# Patient Record
Sex: Female | Born: 1945
Health system: Southern US, Community
[De-identification: ages and names within clinical notes are randomized; demographics above are authoritative.]

## PROBLEM LIST (undated history)

## (undated) DIAGNOSIS — K219 Gastro-esophageal reflux disease without esophagitis: Secondary | ICD-10-CM

## (undated) DIAGNOSIS — N390 Urinary tract infection, site not specified: Secondary | ICD-10-CM

## (undated) DIAGNOSIS — E278 Other specified disorders of adrenal gland: Secondary | ICD-10-CM

## (undated) DIAGNOSIS — K5790 Diverticulosis of intestine, part unspecified, without perforation or abscess without bleeding: Secondary | ICD-10-CM

## (undated) DIAGNOSIS — M810 Age-related osteoporosis without current pathological fracture: Secondary | ICD-10-CM

## (undated) DIAGNOSIS — K5190 Ulcerative colitis, unspecified, without complications: Secondary | ICD-10-CM

## (undated) DIAGNOSIS — F32A Depression, unspecified: Secondary | ICD-10-CM

## (undated) DIAGNOSIS — F329 Major depressive disorder, single episode, unspecified: Secondary | ICD-10-CM

## (undated) DIAGNOSIS — F419 Anxiety disorder, unspecified: Secondary | ICD-10-CM

## (undated) DIAGNOSIS — E785 Hyperlipidemia, unspecified: Secondary | ICD-10-CM

## (undated) DIAGNOSIS — K589 Irritable bowel syndrome without diarrhea: Secondary | ICD-10-CM

## (undated) HISTORY — DX: Other specified disorders of adrenal gland: E27.8

## (undated) HISTORY — DX: Ulcerative colitis, unspecified, without complications: K51.90

## (undated) HISTORY — DX: Diverticulosis of intestine, part unspecified, without perforation or abscess without bleeding: K57.90

## (undated) HISTORY — DX: Depression, unspecified: F32.A

## (undated) HISTORY — PX: OTHER SURGICAL HISTORY: SHX169

## (undated) HISTORY — DX: Irritable bowel syndrome, unspecified: K58.9

## (undated) HISTORY — DX: Age-related osteoporosis without current pathological fracture: M81.0

## (undated) HISTORY — DX: Anxiety disorder, unspecified: F41.9

## (undated) HISTORY — DX: Major depressive disorder, single episode, unspecified: F32.9

## (undated) HISTORY — PX: COLONOSCOPY: SHX174

## (undated) HISTORY — PX: BREAST SURGERY: SHX581

## (undated) HISTORY — DX: Urinary tract infection, site not specified: N39.0

## (undated) HISTORY — DX: Hyperlipidemia, unspecified: E78.5

## (undated) HISTORY — DX: Gastro-esophageal reflux disease without esophagitis: K21.9

---

## 2002-07-20 ENCOUNTER — Encounter: Admission: RE | Admit: 2002-07-20 | Discharge: 2002-07-20 | Payer: Self-pay | Admitting: Family Medicine

## 2002-07-20 ENCOUNTER — Encounter: Payer: Self-pay | Admitting: Family Medicine

## 2003-10-11 ENCOUNTER — Encounter: Admission: RE | Admit: 2003-10-11 | Discharge: 2003-10-11 | Payer: Self-pay | Admitting: Family Medicine

## 2004-07-04 ENCOUNTER — Other Ambulatory Visit: Admission: RE | Admit: 2004-07-04 | Discharge: 2004-07-04 | Payer: Self-pay | Admitting: Family Medicine

## 2004-10-23 ENCOUNTER — Encounter: Admission: RE | Admit: 2004-10-23 | Discharge: 2004-10-23 | Payer: Self-pay | Admitting: Family Medicine

## 2005-12-17 ENCOUNTER — Encounter: Admission: RE | Admit: 2005-12-17 | Discharge: 2005-12-17 | Payer: Self-pay | Admitting: Family Medicine

## 2007-01-08 ENCOUNTER — Encounter: Admission: RE | Admit: 2007-01-08 | Discharge: 2007-01-08 | Payer: Self-pay | Admitting: Family Medicine

## 2008-02-19 ENCOUNTER — Encounter: Admission: RE | Admit: 2008-02-19 | Discharge: 2008-02-19 | Payer: Self-pay | Admitting: Internal Medicine

## 2009-03-17 ENCOUNTER — Encounter: Admission: RE | Admit: 2009-03-17 | Discharge: 2009-03-17 | Payer: Self-pay | Admitting: Internal Medicine

## 2009-08-24 ENCOUNTER — Encounter (INDEPENDENT_AMBULATORY_CARE_PROVIDER_SITE_OTHER): Payer: Self-pay | Admitting: *Deleted

## 2009-08-28 ENCOUNTER — Ambulatory Visit: Payer: Self-pay | Admitting: Internal Medicine

## 2009-09-11 ENCOUNTER — Ambulatory Visit: Payer: Self-pay | Admitting: Internal Medicine

## 2010-02-10 ENCOUNTER — Encounter: Payer: Self-pay | Admitting: Family Medicine

## 2010-02-11 ENCOUNTER — Encounter: Payer: Self-pay | Admitting: Internal Medicine

## 2010-02-19 ENCOUNTER — Other Ambulatory Visit: Payer: Self-pay | Admitting: Internal Medicine

## 2010-02-19 DIAGNOSIS — Z1239 Encounter for other screening for malignant neoplasm of breast: Secondary | ICD-10-CM

## 2010-02-20 NOTE — Procedures (Signed)
Summary: Colonoscopy  Patient: Rhonda Woods Note: All result statuses are Final unless otherwise noted.  Tests: (1) Colonoscopy (COL)   COL Colonoscopy           DONE     [facility]     [address]     [place], [place]  [address]           COLONOSCOPY PROCEDURE REPORT           PATIENT:  Rhonda Woods, Rhonda Woods  MR#:  [id number]     BIRTHDATE:  1945-06-22, 63 yrs. old  GENDER:  female     ENDOSCOPIST:  Wilhemina Bonito. Eda Keys, MD     REF. BY:  Chilton Greathouse, M.D.     PROCEDURE DATE:  09/11/2009     PROCEDURE:  Average-risk screening colonoscopy     G0121     ASA CLASS:  Class II     INDICATIONS:  Routine Risk Screening ; reports negative     colonoscopy about 10 years ago in [place], [place].     MEDICATIONS:   Fentanyl 100 mcg IV, Versed 9 mg IV           DESCRIPTION OF PROCEDURE:   After the risks benefits and     alternatives of the procedure were thoroughly explained, informed     consent was obtained.  Digital rectal exam was performed and     revealed no abnormalities.   The LB CF-[id number] endoscope     was introduced through the anus and advanced to the cecum, which     was identified by both the appendix and ileocecal valve, without     limitations.Time to cecum = 4:02 min. The quality of the prep was     excellent, using MoviPrep.  The instrument was then slowly     withdrawn (time = 12:26 min) as the colon was fully examined.     <<PROCEDUREIMAGES>>           FINDINGS:  Moderate diverticulosis was found in the sigmoid to     descending colon segments.  This was otherwise a normal     examination of the colon.  No polyps or cancers were seen.     Retroflexed views in the rectum revealed internal hemorrhoids.     The scope was then withdrawn from the patient and the procedure     completed.           COMPLICATIONS:  None     ENDOSCOPIC IMPRESSION:     1) Moderate diverticulosis in the sigmoid to descending colon     segments     2) Otherwise normal  examination     3) No polyps or cancers     4) Internal hemorrhoids           RECOMMENDATIONS:     1) Continue current colorectal screening recommendations for     "routine risk" patients with a repeat colonoscopy in 10 years.     ______________________________     Wilhemina Bonito. Eda Keys, MD           CC:  Chilton Greathouse, MD; The Patient           n.     eSIGNED:   Wilhemina Bonito. Eda Keys at 09/11/2009 10:13 AM           SummerPaizlee, Kinder, [id number]  Note: An exclamation mark (!) indicates a result that was not dispersed into the flowsheet. Document Creation Date:  09/11/2009 10:15 AM _______________________________________________________________________  (1) Order result status: Final Collection or observation date-time: 09/11/2009 10:09 Requested date-time:  Receipt date-time:  Reported date-time:  Referring Physician:   Ordering Physician: Fransico Setters [phone]) Specimen Source:  Source: Launa Grill Order Number: [id number] Lab site:   Appended Document: Colonoscopy    Clinical Lists Changes  Observations: Added new observation of COLONNXTDUE: 08/2019 (09/11/2009 13:34)

## 2010-02-20 NOTE — Miscellaneous (Signed)
Summary: previsit prep/rm  Clinical Lists Changes  Medications: Added new medication of MOVIPREP 100 GM  SOLR (PEG-KCL-NACL-NASULF-NA ASC-C) As per prep instructions. - Signed Rx of MOVIPREP 100 GM  SOLR (PEG-KCL-NACL-NASULF-NA ASC-C) As per prep instructions.;  #1 x 0;  Signed;  Entered by: Sundra Aland RN;  Authorized by: Irene Shipper MD;  Method used: Electronically to [facility] # [address] [place], [place]  [address], Ph: [phone], Fax: [phone] Allergies: Added new allergy or adverse reaction of DEMEROL Observations: Added new observation of NKA: F (08/28/2009 10:13)    Prescriptions: MOVIPREP 100 GM  SOLR (PEG-KCL-NACL-NASULF-NA ASC-C) As per prep instructions.  #1 x 0   Entered by:   Sundra Aland RN   Authorized by:   Irene Shipper MD   Signed by:   Sundra Aland RN on 08/28/2009   Method used:   Electronically to        [facility] # [address] (retail)       [place], [place]  [address]       Ph: [phone]       Fax: [phone]   RxID:   [id number]   Appended Document: previsit prep/rm    Clinical Lists Changes  Allergies: Added new allergy or adverse reaction of VICODIN

## 2010-02-20 NOTE — Letter (Signed)
Summary: [facility] Instructions  [facility]  [place], [place] [address]   Phone: [phone]  Fax: [phone]       Rhonda Woods    1945/12/31    MRN: [id number]        Procedure Day /Date:  Monday 09/11/2009     Arrival Time:  8:30 am      Procedure Time: 9:30 am     Location of Procedure:                    _ x_  [place] (4th Floor)                        [place]   Starting 5 days prior to your procedure Wednesday 8/17 do not eat nuts, seeds, popcorn, corn, beans, peas,  salads, or any raw vegetables.  Do not take any fiber supplements (e.g. Metamucil, Citrucel, and Benefiber).  THE DAY BEFORE YOUR PROCEDURE         DATE: Sunday 8/21  1.  Drink clear liquids the entire day-NO SOLID FOOD  2.  Do not drink anything colored red or purple.  Avoid juices with pulp.  No orange juice.  3.  Drink at least 64 oz. (8 glasses) of fluid/clear liquids during the day to prevent dehydration and help the prep work efficiently.  CLEAR LIQUIDS INCLUDE: Water Jello Ice Popsicles Tea (sugar ok, no milk/cream) Powdered fruit flavored drinks Coffee (sugar ok, no milk/cream) Gatorade Juice: apple, white grape, white cranberry  Lemonade Clear bullion, consomm, broth Carbonated beverages (any kind) Strained chicken noodle soup Hard Candy                             4.  In the morning, mix first dose of MoviPrep solution:    Empty 1 Pouch A and 1 Pouch B into the disposable container    Add lukewarm drinking water to the top line of the container. Mix to dissolve    Refrigerate (mixed solution should be used within 24 hrs)  5.  Begin drinking the prep at 5:00 p.m. The MoviPrep container is divided by 4 marks.   Every 15 minutes drink the solution down to the next mark (approximately 8 oz) until the full liter is complete.   6.  Follow completed prep with 16 oz of clear liquid of your choice (Nothing  red or purple).  Continue to drink clear liquids until bedtime.  7.  Before going to bed, mix second dose of MoviPrep solution:    Empty 1 Pouch A and 1 Pouch B into the disposable container    Add lukewarm drinking water to the top line of the container. Mix to dissolve    Refrigerate  THE DAY OF YOUR PROCEDURE      DATE: Monday 8/22  Beginning at 4:30 a.m. (5 hours before procedure):         1. Every 15 minutes, drink the solution down to the next mark (approx 8 oz) until the full liter is complete.  2. Follow completed prep with 16 oz. of clear liquid of your choice.    3. You may drink clear liquids until 7:30 am (2 HOURS BEFORE PROCEDURE).   MEDICATION INSTRUCTIONS  Unless otherwise instructed, you should take regular prescription medications with a small sip of water   as early as possible the morning  of your procedure.    Additional medication instructions: n/a         OTHER INSTRUCTIONS  You will need a responsible adult at least 66 years of age to accompany you and drive you home.   This person must remain in the waiting room during your procedure.  Wear loose fitting clothing that is easily removed.  Leave jewelry and other valuables at home.  However, you may wish to bring a book to read or  an iPod/MP3 player to listen to music as you wait for your procedure to start.  Remove all body piercing jewelry and leave at home.  Total time from sign-in until discharge is approximately 2-3 hours.  You should go home directly after your procedure and rest.  You can resume normal activities the  day after your procedure.  The day of your procedure you should not:   Drive   Make legal decisions   Operate machinery   Drink alcohol   Return to work  You will receive specific instructions about eating, activities and medications before you leave.    The above instructions have been reviewed and explained to me by   Sundra Aland RN  August 28, 2009 10:40  AM     I fully understand and can verbalize these instructions _____________________________ Date _________

## 2010-03-22 ENCOUNTER — Ambulatory Visit
Admission: RE | Admit: 2010-03-22 | Discharge: 2010-03-22 | Disposition: A | Discharge status: Home / Self Care | Payer: BC Managed Care – PPO | Source: Ambulatory Visit | Attending: Internal Medicine | Admitting: Internal Medicine

## 2010-03-22 DIAGNOSIS — Z1239 Encounter for other screening for malignant neoplasm of breast: Secondary | ICD-10-CM

## 2011-03-14 ENCOUNTER — Other Ambulatory Visit: Payer: Self-pay | Admitting: Internal Medicine

## 2011-03-14 DIAGNOSIS — Z1231 Encounter for screening mammogram for malignant neoplasm of breast: Secondary | ICD-10-CM

## 2011-03-29 ENCOUNTER — Ambulatory Visit
Admission: RE | Admit: 2011-03-29 | Discharge: 2011-03-29 | Disposition: A | Discharge status: Home / Self Care | Payer: BC Managed Care – PPO | Source: Ambulatory Visit | Attending: Internal Medicine | Admitting: Internal Medicine

## 2011-03-29 ENCOUNTER — Ambulatory Visit: Payer: BC Managed Care – PPO

## 2011-03-29 DIAGNOSIS — Z1231 Encounter for screening mammogram for malignant neoplasm of breast: Secondary | ICD-10-CM

## 2012-03-20 ENCOUNTER — Other Ambulatory Visit: Payer: Self-pay

## 2012-03-20 DIAGNOSIS — Z1231 Encounter for screening mammogram for malignant neoplasm of breast: Secondary | ICD-10-CM

## 2012-04-24 ENCOUNTER — Ambulatory Visit
Admission: RE | Admit: 2012-04-24 | Discharge: 2012-04-24 | Disposition: A | Discharge status: Home / Self Care | Payer: Medicare Other | Source: Ambulatory Visit

## 2012-04-24 DIAGNOSIS — Z1231 Encounter for screening mammogram for malignant neoplasm of breast: Secondary | ICD-10-CM

## 2012-12-12 ENCOUNTER — Other Ambulatory Visit: Payer: Self-pay | Admitting: Pediatrics

## 2012-12-12 ENCOUNTER — Ambulatory Visit (HOSPITAL_COMMUNITY)
Admission: RE | Admit: 2012-12-12 | Discharge: 2012-12-12 | Disposition: A | Discharge status: Home / Self Care | Payer: Medicare Other | Source: Ambulatory Visit | Attending: Pediatrics | Admitting: Pediatrics

## 2012-12-12 DIAGNOSIS — M25571 Pain in right ankle and joints of right foot: Secondary | ICD-10-CM

## 2012-12-12 DIAGNOSIS — M79609 Pain in unspecified limb: Secondary | ICD-10-CM | POA: Insufficient documentation

## 2012-12-12 DIAGNOSIS — M25579 Pain in unspecified ankle and joints of unspecified foot: Secondary | ICD-10-CM | POA: Insufficient documentation

## 2013-05-19 ENCOUNTER — Other Ambulatory Visit: Payer: Self-pay | Admitting: Internal Medicine

## 2013-05-19 ENCOUNTER — Other Ambulatory Visit: Payer: Self-pay

## 2013-05-19 DIAGNOSIS — Z1231 Encounter for screening mammogram for malignant neoplasm of breast: Secondary | ICD-10-CM

## 2013-07-13 ENCOUNTER — Ambulatory Visit
Admission: RE | Admit: 2013-07-13 | Discharge: 2013-07-13 | Disposition: A | Discharge status: Home / Self Care | Payer: Commercial Managed Care - HMO | Source: Ambulatory Visit

## 2013-07-13 ENCOUNTER — Encounter (INDEPENDENT_AMBULATORY_CARE_PROVIDER_SITE_OTHER): Payer: Self-pay

## 2013-07-13 DIAGNOSIS — Z1231 Encounter for screening mammogram for malignant neoplasm of breast: Secondary | ICD-10-CM

## 2013-09-23 ENCOUNTER — Encounter: Payer: Self-pay | Admitting: Internal Medicine

## 2014-07-15 ENCOUNTER — Other Ambulatory Visit: Payer: Self-pay

## 2014-07-15 DIAGNOSIS — Z1231 Encounter for screening mammogram for malignant neoplasm of breast: Secondary | ICD-10-CM

## 2014-10-11 ENCOUNTER — Other Ambulatory Visit: Payer: Self-pay | Admitting: Internal Medicine

## 2014-10-11 ENCOUNTER — Ambulatory Visit
Admission: RE | Admit: 2014-10-11 | Discharge: 2014-10-11 | Disposition: A | Discharge status: Home / Self Care | Payer: Commercial Managed Care - HMO | Source: Ambulatory Visit

## 2014-10-11 DIAGNOSIS — Z1231 Encounter for screening mammogram for malignant neoplasm of breast: Secondary | ICD-10-CM | POA: Diagnosis not present

## 2014-10-11 DIAGNOSIS — R928 Other abnormal and inconclusive findings on diagnostic imaging of breast: Secondary | ICD-10-CM

## 2014-10-19 ENCOUNTER — Ambulatory Visit
Admission: RE | Admit: 2014-10-19 | Discharge: 2014-10-19 | Disposition: A | Discharge status: Home / Self Care | Payer: Commercial Managed Care - HMO | Source: Ambulatory Visit | Attending: Internal Medicine | Admitting: Internal Medicine

## 2014-10-19 DIAGNOSIS — R928 Other abnormal and inconclusive findings on diagnostic imaging of breast: Secondary | ICD-10-CM

## 2014-10-19 DIAGNOSIS — N63 Unspecified lump in breast: Secondary | ICD-10-CM | POA: Diagnosis not present

## 2014-10-26 ENCOUNTER — Other Ambulatory Visit: Payer: Commercial Managed Care - HMO

## 2014-10-26 ENCOUNTER — Other Ambulatory Visit: Payer: Self-pay | Admitting: Internal Medicine

## 2014-10-26 DIAGNOSIS — N6489 Other specified disorders of breast: Secondary | ICD-10-CM

## 2014-11-07 DIAGNOSIS — H5210 Myopia, unspecified eye: Secondary | ICD-10-CM | POA: Diagnosis not present

## 2014-11-07 DIAGNOSIS — H524 Presbyopia: Secondary | ICD-10-CM | POA: Diagnosis not present

## 2014-12-19 ENCOUNTER — Other Ambulatory Visit: Payer: Commercial Managed Care - HMO

## 2014-12-20 ENCOUNTER — Ambulatory Visit
Admission: RE | Admit: 2014-12-20 | Discharge: 2014-12-20 | Disposition: A | Discharge status: Home / Self Care | Payer: Commercial Managed Care - HMO | Source: Ambulatory Visit | Attending: Internal Medicine | Admitting: Internal Medicine

## 2014-12-20 DIAGNOSIS — N641 Fat necrosis of breast: Secondary | ICD-10-CM | POA: Diagnosis not present

## 2014-12-20 DIAGNOSIS — N6489 Other specified disorders of breast: Secondary | ICD-10-CM

## 2014-12-29 ENCOUNTER — Other Ambulatory Visit: Payer: Self-pay | Admitting: Internal Medicine

## 2014-12-29 DIAGNOSIS — N6489 Other specified disorders of breast: Secondary | ICD-10-CM

## 2015-01-12 DIAGNOSIS — R7301 Impaired fasting glucose: Secondary | ICD-10-CM | POA: Diagnosis not present

## 2015-01-12 DIAGNOSIS — N390 Urinary tract infection, site not specified: Secondary | ICD-10-CM | POA: Diagnosis not present

## 2015-01-12 DIAGNOSIS — I10 Essential (primary) hypertension: Secondary | ICD-10-CM | POA: Diagnosis not present

## 2015-01-12 DIAGNOSIS — M810 Age-related osteoporosis without current pathological fracture: Secondary | ICD-10-CM | POA: Diagnosis not present

## 2015-01-12 DIAGNOSIS — R8299 Other abnormal findings in urine: Secondary | ICD-10-CM | POA: Diagnosis not present

## 2015-01-12 DIAGNOSIS — E7800 Pure hypercholesterolemia, unspecified: Secondary | ICD-10-CM | POA: Diagnosis not present

## 2015-01-20 DIAGNOSIS — K219 Gastro-esophageal reflux disease without esophagitis: Secondary | ICD-10-CM | POA: Diagnosis not present

## 2015-01-20 DIAGNOSIS — M810 Age-related osteoporosis without current pathological fracture: Secondary | ICD-10-CM | POA: Diagnosis not present

## 2015-01-20 DIAGNOSIS — R7301 Impaired fasting glucose: Secondary | ICD-10-CM | POA: Diagnosis not present

## 2015-01-20 DIAGNOSIS — R928 Other abnormal and inconclusive findings on diagnostic imaging of breast: Secondary | ICD-10-CM | POA: Diagnosis not present

## 2015-01-20 DIAGNOSIS — Z0000 Encounter for general adult medical examination without abnormal findings: Secondary | ICD-10-CM | POA: Diagnosis not present

## 2015-01-20 DIAGNOSIS — K5790 Diverticulosis of intestine, part unspecified, without perforation or abscess without bleeding: Secondary | ICD-10-CM | POA: Diagnosis not present

## 2015-01-20 DIAGNOSIS — E7800 Pure hypercholesterolemia, unspecified: Secondary | ICD-10-CM | POA: Diagnosis not present

## 2015-01-20 DIAGNOSIS — I10 Essential (primary) hypertension: Secondary | ICD-10-CM | POA: Diagnosis not present

## 2015-02-08 DIAGNOSIS — N390 Urinary tract infection, site not specified: Secondary | ICD-10-CM | POA: Diagnosis not present

## 2015-02-09 DIAGNOSIS — Z1212 Encounter for screening for malignant neoplasm of rectum: Secondary | ICD-10-CM | POA: Diagnosis not present

## 2015-02-22 DIAGNOSIS — Z1212 Encounter for screening for malignant neoplasm of rectum: Secondary | ICD-10-CM | POA: Diagnosis not present

## 2015-02-23 ENCOUNTER — Ambulatory Visit
Admission: RE | Admit: 2015-02-23 | Discharge: 2015-02-23 | Disposition: A | Discharge status: Home / Self Care | Payer: Commercial Managed Care - HMO | Source: Ambulatory Visit | Attending: Internal Medicine | Admitting: Internal Medicine

## 2015-02-23 DIAGNOSIS — N6489 Other specified disorders of breast: Secondary | ICD-10-CM

## 2015-02-23 DIAGNOSIS — N641 Fat necrosis of breast: Secondary | ICD-10-CM | POA: Diagnosis not present

## 2015-03-27 DIAGNOSIS — M810 Age-related osteoporosis without current pathological fracture: Secondary | ICD-10-CM | POA: Diagnosis not present

## 2015-08-29 ENCOUNTER — Other Ambulatory Visit: Payer: Self-pay | Admitting: Internal Medicine

## 2015-08-30 ENCOUNTER — Other Ambulatory Visit: Payer: Self-pay | Admitting: Internal Medicine

## 2015-08-30 DIAGNOSIS — N6489 Other specified disorders of breast: Secondary | ICD-10-CM

## 2015-08-30 DIAGNOSIS — R928 Other abnormal and inconclusive findings on diagnostic imaging of breast: Secondary | ICD-10-CM

## 2015-08-30 DIAGNOSIS — M7989 Other specified soft tissue disorders: Secondary | ICD-10-CM

## 2015-09-12 ENCOUNTER — Other Ambulatory Visit: Payer: Commercial Managed Care - HMO

## 2015-10-03 DIAGNOSIS — Z23 Encounter for immunization: Secondary | ICD-10-CM | POA: Diagnosis not present

## 2015-10-27 ENCOUNTER — Ambulatory Visit
Admission: RE | Admit: 2015-10-27 | Discharge: 2015-10-27 | Disposition: A | Discharge status: Home / Self Care | Payer: Commercial Managed Care - HMO | Source: Ambulatory Visit | Attending: Internal Medicine | Admitting: Internal Medicine

## 2015-10-27 DIAGNOSIS — M7989 Other specified soft tissue disorders: Secondary | ICD-10-CM

## 2015-10-27 DIAGNOSIS — R928 Other abnormal and inconclusive findings on diagnostic imaging of breast: Secondary | ICD-10-CM | POA: Diagnosis not present

## 2015-10-27 DIAGNOSIS — N6489 Other specified disorders of breast: Secondary | ICD-10-CM

## 2016-01-08 DIAGNOSIS — H33313 Horseshoe tear of retina without detachment, bilateral: Secondary | ICD-10-CM | POA: Diagnosis not present

## 2016-01-08 DIAGNOSIS — H5213 Myopia, bilateral: Secondary | ICD-10-CM | POA: Diagnosis not present

## 2016-02-01 DIAGNOSIS — R7301 Impaired fasting glucose: Secondary | ICD-10-CM | POA: Diagnosis not present

## 2016-02-01 DIAGNOSIS — E7800 Pure hypercholesterolemia, unspecified: Secondary | ICD-10-CM | POA: Diagnosis not present

## 2016-02-01 DIAGNOSIS — M810 Age-related osteoporosis without current pathological fracture: Secondary | ICD-10-CM | POA: Diagnosis not present

## 2016-02-01 DIAGNOSIS — I10 Essential (primary) hypertension: Secondary | ICD-10-CM | POA: Diagnosis not present

## 2016-02-15 DIAGNOSIS — F4322 Adjustment disorder with anxiety: Secondary | ICD-10-CM | POA: Diagnosis not present

## 2016-02-15 DIAGNOSIS — K219 Gastro-esophageal reflux disease without esophagitis: Secondary | ICD-10-CM | POA: Diagnosis not present

## 2016-02-15 DIAGNOSIS — I10 Essential (primary) hypertension: Secondary | ICD-10-CM | POA: Diagnosis not present

## 2016-02-15 DIAGNOSIS — K5790 Diverticulosis of intestine, part unspecified, without perforation or abscess without bleeding: Secondary | ICD-10-CM | POA: Diagnosis not present

## 2016-02-15 DIAGNOSIS — M810 Age-related osteoporosis without current pathological fracture: Secondary | ICD-10-CM | POA: Diagnosis not present

## 2016-02-15 DIAGNOSIS — R7301 Impaired fasting glucose: Secondary | ICD-10-CM | POA: Diagnosis not present

## 2016-02-15 DIAGNOSIS — Z0000 Encounter for general adult medical examination without abnormal findings: Secondary | ICD-10-CM | POA: Diagnosis not present

## 2016-02-15 DIAGNOSIS — E784 Other hyperlipidemia: Secondary | ICD-10-CM | POA: Diagnosis not present

## 2016-02-15 DIAGNOSIS — R809 Proteinuria, unspecified: Secondary | ICD-10-CM | POA: Diagnosis not present

## 2016-02-26 DIAGNOSIS — F4322 Adjustment disorder with anxiety: Secondary | ICD-10-CM | POA: Diagnosis not present

## 2016-02-28 DIAGNOSIS — I10 Essential (primary) hypertension: Secondary | ICD-10-CM | POA: Diagnosis not present

## 2016-03-06 DIAGNOSIS — F4322 Adjustment disorder with anxiety: Secondary | ICD-10-CM | POA: Diagnosis not present

## 2016-03-13 DIAGNOSIS — Z1212 Encounter for screening for malignant neoplasm of rectum: Secondary | ICD-10-CM | POA: Diagnosis not present

## 2016-03-14 DIAGNOSIS — F4322 Adjustment disorder with anxiety: Secondary | ICD-10-CM | POA: Diagnosis not present

## 2016-03-27 DIAGNOSIS — F4322 Adjustment disorder with anxiety: Secondary | ICD-10-CM | POA: Diagnosis not present

## 2016-04-09 DIAGNOSIS — Z1212 Encounter for screening for malignant neoplasm of rectum: Secondary | ICD-10-CM | POA: Diagnosis not present

## 2016-04-10 ENCOUNTER — Encounter: Payer: Self-pay | Admitting: Internal Medicine

## 2016-05-01 DIAGNOSIS — F4322 Adjustment disorder with anxiety: Secondary | ICD-10-CM | POA: Diagnosis not present

## 2016-05-15 DIAGNOSIS — F411 Generalized anxiety disorder: Secondary | ICD-10-CM | POA: Diagnosis not present

## 2016-05-17 ENCOUNTER — Encounter (INDEPENDENT_AMBULATORY_CARE_PROVIDER_SITE_OTHER): Payer: Self-pay

## 2016-05-17 ENCOUNTER — Encounter: Payer: Self-pay | Admitting: Internal Medicine

## 2016-05-17 ENCOUNTER — Ambulatory Visit (INDEPENDENT_AMBULATORY_CARE_PROVIDER_SITE_OTHER): Payer: Medicare HMO | Admitting: Internal Medicine

## 2016-05-17 VITALS — BP 150/98 | HR 57 | Ht 65.0 in | Wt 150.0 lb

## 2016-05-17 DIAGNOSIS — K625 Hemorrhage of anus and rectum: Secondary | ICD-10-CM

## 2016-05-17 DIAGNOSIS — R195 Other fecal abnormalities: Secondary | ICD-10-CM | POA: Diagnosis not present

## 2016-05-17 MED ORDER — NA SULFATE-K SULFATE-MG SULF 17.5-3.13-1.6 GM/177ML PO SOLN: 1.0000 | Freq: Once | ORAL | 0 refills | Status: AC

## 2016-05-17 NOTE — Progress Notes (Signed)
HISTORY OF PRESENT ILLNESS:  Rhonda Woods is a 71 y.o. female with a history of GERD, hyperlipidemia, IBS, and anxiety disorder who is referred today by her primary care physician Dr. Dagmar Woods with chief complaint of Hemoccult-positive stool and rectal bleeding. She is accompanied by her husband. Patient was last seen 09/11/2009 when she underwent screening colonoscopy. At that time she reported having had a negative colonoscopy 10 years previously in [place]. In any event, that examination in 2011 revealed moderate left-sided diverticulosis and internal hemorrhoids. Otherwise normal. Routine follow-up in 10 years recommended. Review of outside records shows Hemoccult-positive stool on outpatient testing on multiple occasions. Review of blood work shows normal hemoglobin of 14.4. Normal comprehensive metabolic panel. Patient does report some intermittent rectal bleeding as manifested by red blood per rectum. She's had this off and on for months. She is concerned about rectal cancer. Bowel habits chronically alternate between constipation and diarrhea. No real change. No associated anal discomfort. She does have bloating and gas. On omeprazole 20 mg daily for GERD. Symptoms controlled  REVIEW OF SYSTEMS:  All non-GI ROS negative unless otherwise stated in history of present illness except for anxiety, depression, night sweats, urinary leakage with Valsalva  Past Medical History:  Diagnosis Date  . Adrenal mass, right ([place])   . Anxiety   . Depression   . Diverticulosis   . GERD (gastroesophageal reflux disease)   . Hyperlipidemia   . IBS (irritable bowel syndrome)   . Osteoporosis   . UTI (urinary tract infection)     Past Surgical History:  Procedure Laterality Date  . BREAST SURGERY Left    cyst removal  . cataracts      Social History Rhonda Woods  reports that she quit smoking about 9 years ago. Her smoking use included Cigarettes. She has never used smokeless tobacco. She  reports that she drinks alcohol. Her drug history is not on file.  family history includes Bladder Cancer in her brother; CVA in her mother; Dementia in her brother; Diabetes in her father; Hypertension in her mother; Parkinson's disease in her father.  Allergies  Allergen Reactions  . Hydrocodone-Acetaminophen     REACTION: spinning  . Meperidine Hcl     REACTION: hives       PHYSICAL EXAMINATION: Vital signs: BP (!) 150/98   Pulse (!) 57   Ht 5' 5"  (1.651 m)   Wt 150 lb (68 kg)   SpO2 99%   BMI 24.96 kg/m   Constitutional: generally well-appearing, no acute distress Psychiatric: Anxious, alert and oriented x3, cooperative Eyes: extraocular movements intact, anicteric, conjunctiva pink Mouth: oral pharynx moist, no lesions Neck: supple no lymphadenopathy Cardiovascular: heart regular rate and rhythm, no murmur Lungs: clear to auscultation bilaterally Abdomen: soft, nontender, nondistended, no obvious ascites, no peritoneal signs, normal bowel sounds, no organomegaly Rectal:Deferred until colonoscopy Extremities: no clubbing cyanosis or lower extremity edema bilaterally Skin: no lesions on visible extremities Neuro: No focal deficits. Normal DTRs. Cranial nerves intact  ASSESSMENT:  #1. Intermittent rectal bleeding. History of internal hemorrhoids. Likely the cause #2. Hemoccult-positive stool. Normal hemoglobin. Rule out interval colonic neoplasia #3. Last colonoscopy 2011 with diverticulosis and hemorrhoids #4. GERD. Symptoms controlled with low-dose PPI #5. History of IBS. Symptoms compatible with the same. No change   PLAN:  #1. Schedule colonoscopy.The nature of the procedure, as well as the risks, benefits, and alternatives were carefully and thoroughly reviewed with the patient. Ample time for discussion and questions allowed. The  patient understood, was satisfied, and agreed to proceed. #2. Reflux precautions #3. Continue PPI. Lowest dose to control  symptoms #4. May benefit from regular fiber supplementation   A copy of this consultation note has been sent to Dr. Dagmar Woods

## 2016-05-17 NOTE — Patient Instructions (Signed)

## 2016-05-20 ENCOUNTER — Ambulatory Visit (AMBULATORY_SURGERY_CENTER): Payer: Medicare HMO | Admitting: Internal Medicine

## 2016-05-20 ENCOUNTER — Telehealth: Payer: Self-pay | Admitting: Internal Medicine

## 2016-05-20 ENCOUNTER — Encounter: Payer: Self-pay | Admitting: Internal Medicine

## 2016-05-20 VITALS — BP 153/76 | HR 62 | Temp 97.1°F | Resp 14 | Ht 65.0 in | Wt 150.0 lb

## 2016-05-20 DIAGNOSIS — K5100 Ulcerative (chronic) pancolitis without complications: Secondary | ICD-10-CM

## 2016-05-20 DIAGNOSIS — D123 Benign neoplasm of transverse colon: Secondary | ICD-10-CM | POA: Diagnosis not present

## 2016-05-20 DIAGNOSIS — K219 Gastro-esophageal reflux disease without esophagitis: Secondary | ICD-10-CM | POA: Diagnosis not present

## 2016-05-20 DIAGNOSIS — R195 Other fecal abnormalities: Secondary | ICD-10-CM

## 2016-05-20 DIAGNOSIS — K529 Noninfective gastroenteritis and colitis, unspecified: Secondary | ICD-10-CM | POA: Diagnosis not present

## 2016-05-20 DIAGNOSIS — I10 Essential (primary) hypertension: Secondary | ICD-10-CM | POA: Diagnosis not present

## 2016-05-20 MED ORDER — MESALAMINE 1.2 G PO TBEC: DELAYED_RELEASE_TABLET | ORAL | 11 refills | Status: DC

## 2016-05-20 MED ORDER — SODIUM CHLORIDE 0.9 % IV SOLN: 500.0000 mL | INTRAVENOUS | Status: AC

## 2016-05-20 MED ORDER — MESALAMINE 4 G RE ENEM: 4.0000 g | ENEMA | Freq: Every day | RECTAL | 6 refills | Status: DC

## 2016-05-20 NOTE — Patient Instructions (Signed)
YOU HAD AN ENDOSCOPIC PROCEDURE TODAY AT [facility]:   Refer to the procedure report that was given to you for any specific questions about what was found during the examination.  If the procedure report does not answer your questions, please call your gastroenterologist to clarify.  If you requested that your care partner not be given the details of your procedure findings, then the procedure report has been included in a sealed envelope for you to review at your convenience later.  YOU SHOULD EXPECT: Some feelings of bloating in the abdomen. Passage of more gas than usual.  Walking can help get rid of the air that was put into your GI tract during the procedure and reduce the bloating. If you had a lower endoscopy (such as a colonoscopy or flexible sigmoidoscopy) you may notice spotting of blood in your stool or on the toilet paper. If you underwent a bowel prep for your procedure, you may not have a normal bowel movement for a few days.  Please Note:  You might notice some irritation and congestion in your nose or some drainage.  This is from the oxygen used during your procedure.  There is no need for concern and it should clear up in a day or so.  SYMPTOMS TO REPORT IMMEDIATELY:   Following lower endoscopy (colonoscopy or flexible sigmoidoscopy):  Excessive amounts of blood in the stool  Significant tenderness or worsening of abdominal pains  Swelling of the abdomen that is new, acute  Fever of 100F or higher  For urgent or emergent issues, a gastroenterologist can be reached at any hour by calling [phone].   DIET:  We do recommend a small meal at first, but then you may proceed to your regular diet.  Drink plenty of fluids but you should avoid alcoholic beverages for 24 hours.  MEDICATIONS: Prescribe Rowasa enemas; #30; 1 per rectum daily at bedtime; 6 refills. Prescribe Lialda 1.2 g tablets; #60; take 2 each morning; 11 refills.  Please handouts given to you by  your recovery nurse.  ACTIVITY:  You should plan to take it easy for the rest of today and you should NOT DRIVE or use heavy machinery until tomorrow (because of the sedation medicines used during the test).    FOLLOW UP:  Follow-up with Dr. Henrene Pastor in 6 weeks.  Our staff will call the number listed on your records the next business day following your procedure to check on you and address any questions or concerns that you may have regarding the information given to you following your procedure. If we do not reach you, we will leave a message.  However, if you are feeling well and you are not experiencing any problems, there is no need to return our call.  We will assume that you have returned to your regular daily activities without incident.  If any biopsies were taken you will be contacted by phone or by letter within the next 1-3 weeks.  Please call us at [phone] if you have not heard about the biopsies in 3 weeks.   Thank you for allowing [place] to provide for your healthcare needs today.  SIGNATURES/CONFIDENTIALITY: You and/or your care partner have signed paperwork which will be entered into your electronic medical record.  These signatures attest to the fact that that the information above on your After Visit Summary has been reviewed and is understood.  Full responsibility of the confidentiality of this discharge information lies with you and/or your care-partner.

## 2016-05-20 NOTE — Op Note (Signed)
[place] Patient Name: Rhonda Woods Procedure Date: 05/20/2016 1:14 PM MRN: [id number] Endoscopist: Docia Chuck. Henrene Pastor , MD Age: 71 Referring MD:  Date of Birth: 1945/08/30 Gender: Female Account #: [id number] Procedure:                Colonoscopy, with cold snare polypectomy x 1 and                            biopsies Indications:              Heme positive stool, Rectal bleeding. Last                            colonoscopy August 2011 with diverticulosis and                            hemorrhoids. Also had colonoscopy 10 years prior to                            that in [place] which was negative Medicines:                Monitored Anesthesia Care Procedure:                Pre-Anesthesia Assessment:                           - Prior to the procedure, a History and Physical                            was performed, and patient medications and                            allergies were reviewed. The patient's tolerance of                            previous anesthesia was also reviewed. The risks                            and benefits of the procedure and the sedation                            options and risks were discussed with the patient.                            All questions were answered, and informed consent                            was obtained. Prior Anticoagulants: The patient has                            taken no previous anticoagulant or antiplatelet                            agents. ASA Grade Assessment: II - A patient with  mild systemic disease. After reviewing the risks                            and benefits, the patient was deemed in                            satisfactory condition to undergo the procedure.                           After obtaining informed consent, the colonoscope                            was passed under direct vision. Throughout the                            procedure, the patient's blood pressure,  pulse, and                            oxygen saturations were monitored continuously. The                            Colonoscope was introduced through the anus and                            advanced to the the cecum, identified by                            appendiceal orifice and ileocecal valve. The                            terminal ileum, ileocecal valve, appendiceal                            orifice, and rectum were photographed. The quality                            of the bowel preparation was excellent. The                            colonoscopy was performed without difficulty. The                            patient tolerated the procedure well. The bowel                            preparation used was SUPREP. Scope In: 1:28:23 PM Scope Out: 1:44:40 PM Scope Withdrawal Time: 0 hours 12 minutes 48 seconds  Total Procedure Duration: 0 hours 16 minutes 17 seconds  Findings:                 Inflammation characterized by congestion (edema),                            erosions, erythema, friability and mucus was found  in a continuous and circumferential pattern from                            the anus to the sigmoid colon. There was a short                            segment of intervening rectum that was spared. This                            was moderate in severity. Biopsies were taken with                            a cold forceps for histology.                           A 2 mm polyp was found in the transverse colon. The                            polyp was removed with a cold snare. Resection and                            retrieval were complete.                           Multiple small and large-mouthed diverticula were                            found in the sigmoid colon.                           Internal hemorrhoids were found during retroflexion.                           The exam was otherwise without abnormality on                             direct and retroflexion views. Complications:            No immediate complications. Estimated blood loss:                            None. Estimated Blood Loss:     Estimated blood loss: none. Impression:               - Proctosigmoid colitis. Inflammation was found                            from the anus to the sigmoid colon. This was                            moderate in severity. Biopsied.                           - One 2 mm polyp in the transverse colon, removed  with a cold snare. Resected and retrieved.                           - Diverticulosis in the sigmoid colon.                           - Internal hemorrhoids.                           - The examination was otherwise normal on direct                            and retroflexion views. Recommendation:           1. Prescribe Rowasa enemas; #30; 1 PR daily at                            bedtime; 6 refills                           2. Prescribe Lialda 1.2 g tablets; #60; take 2 each                            morning; 11 refills.                           3. Await pathology results. Dr. Henrene Pastor will send you                            a letter with the results.                           4. Office follow-up with Dr. Henrene Pastor in 6 weeks                           5. Surveillance colonoscopy in 5 years if polyp                            adenomatous. Otherwise when necessary examinations [name], MD 05/20/2016 1:59:40 PM This report has been signed electronically.

## 2016-05-20 NOTE — Progress Notes (Signed)
Report to PACU, RN, vss, BBS= Clear.  

## 2016-05-20 NOTE — Progress Notes (Signed)
Called to room to assist during endoscopic procedure.  Patient ID and intended procedure confirmed with present staff. Received instructions for my participation in the procedure from the performing physician.  

## 2016-05-21 ENCOUNTER — Telehealth: Payer: Self-pay | Admitting: Gastroenterology

## 2016-05-21 ENCOUNTER — Telehealth: Payer: Self-pay | Admitting: *Deleted

## 2016-05-21 NOTE — Telephone Encounter (Signed)
  Follow up Call-  Call back number 05/20/2016  Post procedure Call Back phone  # [phone]  Permission to leave phone message Yes  Some recent data might be hidden     Patient questions:  Do you have a fever, pain , or abdominal swelling? No. Pain Score  0 *  Have you tolerated food without any problems? Yes.    Have you been able to return to your normal activities? Yes.    Do you have any questions about your discharge instructions: Diet   No. Medications  No. Follow up visit  No.  Do you have questions or concerns about your Care? No.  Actions: * If pain score is 4 or above: No action needed, pain <4.  Pt. Stated that she had a little more bleeding than she expected and she called the on call doctor. Pt. Came in for bleeding and medication RX. Given.

## 2016-05-21 NOTE — Telephone Encounter (Signed)
On call note. Pt relates small volume hematochezia this morning with blood on wiping. No blood in commode. No abd pain, fever. Reviewed colonoscopy with 2 mm polyps removed by cold snare, internal hemorrhoids and proctosigmoiditis noted. Had bleeding prior to the procedure. Bleeding likely from hemorrhoids or proctosigmoiditis. Pt advised to call if bleeding worsens. Pt also relates that prescription was expensive and she would like something else so I am forwarding this to Dr. Henrene Pastor and his nurse.

## 2016-05-21 NOTE — Telephone Encounter (Signed)
Thank you Norberto Sorenson.  Vaughan Basta, contact this patient and see what prescription she is talking about. She has acute active proctosigmoiditis which explains her bleeding that she has had for some time. Also see if there are substitute alternatives for which ever mesalamine product she is eluding to. Thanks

## 2016-05-21 NOTE — Telephone Encounter (Signed)
Pt called back and states the lialda is going to cost her 400.00. Instructed pt to call her insurance company to see what mesalamine drug they will cover. Pt knows to call us back with the information.

## 2016-05-21 NOTE — Telephone Encounter (Signed)
Pt states the Rowasa enemas are over 200.00, that is the script that was to expensive. Please advise.

## 2016-05-21 NOTE — Telephone Encounter (Signed)
Recommend Rowasa enemas as prescribed

## 2016-05-21 NOTE — Telephone Encounter (Signed)
Spoke with pt and she is aware.

## 2016-05-22 ENCOUNTER — Encounter: Payer: Self-pay | Admitting: Internal Medicine

## 2016-05-22 NOTE — Telephone Encounter (Signed)
Please advise 

## 2016-05-22 NOTE — Telephone Encounter (Signed)
Rhonda Woods, She called about this yesterday. I have no control over the cost. Alternative therapy such as steroids could be considered, but there is a significant side effect profile. Recommend that she continue with the initial recommended therapies. Magda Paganini, I will have Rhonda Woods talk with her again

## 2016-05-22 NOTE — Telephone Encounter (Signed)
Have spoke with pt and via mychart messages, see previous messages.

## 2016-05-23 ENCOUNTER — Encounter: Payer: Self-pay | Admitting: Internal Medicine

## 2016-05-29 ENCOUNTER — Telehealth: Payer: Self-pay | Admitting: Internal Medicine

## 2016-05-29 DIAGNOSIS — F411 Generalized anxiety disorder: Secondary | ICD-10-CM | POA: Diagnosis not present

## 2016-05-29 NOTE — Telephone Encounter (Signed)
Reviewed path letter with pt and questions were answered.

## 2016-06-03 ENCOUNTER — Encounter: Payer: Self-pay | Admitting: Internal Medicine

## 2016-06-12 DIAGNOSIS — F411 Generalized anxiety disorder: Secondary | ICD-10-CM | POA: Diagnosis not present

## 2016-06-14 ENCOUNTER — Telehealth: Payer: Self-pay

## 2016-06-14 ENCOUNTER — Encounter: Payer: Self-pay | Admitting: Internal Medicine

## 2016-06-14 NOTE — Telephone Encounter (Signed)
Advised pt to not lap in treatment and to just ask for a partial refill to last her until her follow up office visit.

## 2016-06-26 DIAGNOSIS — F411 Generalized anxiety disorder: Secondary | ICD-10-CM | POA: Diagnosis not present

## 2016-07-02 ENCOUNTER — Ambulatory Visit (INDEPENDENT_AMBULATORY_CARE_PROVIDER_SITE_OTHER): Payer: Medicare HMO | Admitting: Internal Medicine

## 2016-07-02 ENCOUNTER — Encounter: Payer: Self-pay | Admitting: Internal Medicine

## 2016-07-02 VITALS — BP 132/78 | HR 64 | Ht 65.0 in | Wt 149.0 lb

## 2016-07-02 DIAGNOSIS — K5180 Other ulcerative colitis without complications: Secondary | ICD-10-CM | POA: Diagnosis not present

## 2016-07-02 NOTE — Progress Notes (Signed)
HISTORY OF PRESENT ILLNESS:  Rhonda Woods is a 71 y.o. female with a history of GERD, IBS, anxiety, and hyperlipidemia who was evaluated in the office 05/17/2016 for rectal bleeding and Hemoccult-positive stool. Patient had undergone previous colonoscopy in 2011 which revealed moderate diverticulosis and hemorrhoids. Due to current active symptoms colonoscopy was performed 05/20/2016. The patient was found to have proctosigmoiditis consistent with inflammatory bowel disease. Biopsies were consistent with the same. An incidental transverse colon polyp was removed and found to be a tubular adenoma. Diverticulosis and hemorrhoids again present. She was prescribed Rowasa enemas and Lialda 2.4 g daily. Proximally 1-2 weeks after initiating therapy the patient reports complete resolution of symptoms. She continues on both agents. Currently with 2 formed bowel movements per day. No blood or mucus. No abdominal pain. Tolerating medication well. Only complaint is medication cost.  REVIEW OF SYSTEMS:  All non-GI ROS negative except for depression, anxiety  Past Medical History:  Diagnosis Date  . Adrenal mass, right ([place])   . Anxiety   . Depression   . Diverticulosis   . GERD (gastroesophageal reflux disease)   . Hyperlipidemia   . IBS (irritable bowel syndrome)   . Osteoporosis   . UTI (urinary tract infection)     Past Surgical History:  Procedure Laterality Date  . BREAST SURGERY Left    cyst removal  . cataracts      Social History Rhonda Woods  reports that she quit smoking about 9 years ago. Her smoking use included Cigarettes. She has never used smokeless tobacco. She reports that she drinks alcohol. Her drug history is not on file.  family history includes Bladder Cancer in her brother; CVA in her mother; Dementia in her brother; Diabetes in her father; Hypertension in her mother; Parkinson's disease in her father.  Allergies  Allergen Reactions  . Demerol [Meperidine]   .  Hydrocodone-Acetaminophen     REACTION: spinning  . Meperidine Hcl     REACTION: hives  . Vicodin [Hydrocodone-Acetaminophen]        PHYSICAL EXAMINATION: Vital signs: BP 132/78 (BP Location: Left Arm, Patient Position: Sitting, Cuff Size: Normal)   Pulse 64   Ht 5\' 5"  (1.651 m) Comment: height measured without shoes  Wt 149 lb (67.6 kg)   BMI 24.79 kg/m   Constitutional: generally well-appearing, no acute distress Psychiatric: alert and oriented x3, cooperative Eyes: extraocular movements intact, anicteric, conjunctiva pink Mouth: oral pharynx moist, no lesions Neck: supple no lymphadenopathy Cardiovascular: heart regular rate and rhythm, no murmur Lungs: clear to auscultation bilaterally Abdomen: soft, nontender, nondistended, no obvious ascites, no peritoneal signs, normal bowel sounds, no organomegaly Rectal:Omitted Extremities: no clubbing cyanosis or lower extremity edema bilaterally Skin: no lesions on visible extremities Neuro: No focal deficits. Cranial nerves intact  ASSESSMENT:  #1. Chronic ulcerative proctosigmoiditis. Responded nicely to topical and oral mesalamine combination therapy #2. Incidental tubular adenoma #3. Incidental diverticulosis #4. GERD. Symptoms controlled on PPI #4. History of IBS  PLAN:  #1. Continue combination therapy for an additional month. If doing well at that time discontinued topical therapy and continue oral Lialda 2.4 g daily #2. Routine GI follow-up 3 months #3. Contact the office in the interim for any questions or problems #4. Routine surveillance colonoscopy 5 years #5. Continue reflux precautions and low dose PPI for GERD  25 minutes spent face-to-face with the patient. Greater than 50% a time use for counseling regarding her new diagnosis of ulcerative colitis and its management

## 2016-07-02 NOTE — Patient Instructions (Signed)
Continue to use your enemas until they're gone.  Continue Lialda  Please follow up as needed

## 2016-07-17 DIAGNOSIS — F411 Generalized anxiety disorder: Secondary | ICD-10-CM | POA: Diagnosis not present

## 2016-08-07 ENCOUNTER — Encounter: Payer: Self-pay | Admitting: Internal Medicine

## 2016-08-07 DIAGNOSIS — F411 Generalized anxiety disorder: Secondary | ICD-10-CM | POA: Diagnosis not present

## 2016-08-28 DIAGNOSIS — F411 Generalized anxiety disorder: Secondary | ICD-10-CM | POA: Diagnosis not present

## 2016-09-10 DIAGNOSIS — N390 Urinary tract infection, site not specified: Secondary | ICD-10-CM | POA: Diagnosis not present

## 2016-09-11 DIAGNOSIS — F411 Generalized anxiety disorder: Secondary | ICD-10-CM | POA: Diagnosis not present

## 2016-09-18 DIAGNOSIS — F411 Generalized anxiety disorder: Secondary | ICD-10-CM | POA: Diagnosis not present

## 2016-10-05 DIAGNOSIS — Z23 Encounter for immunization: Secondary | ICD-10-CM | POA: Diagnosis not present

## 2016-10-10 DIAGNOSIS — Z6824 Body mass index (BMI) 24.0-24.9, adult: Secondary | ICD-10-CM | POA: Diagnosis not present

## 2016-10-10 DIAGNOSIS — M7022 Olecranon bursitis, left elbow: Secondary | ICD-10-CM | POA: Diagnosis not present

## 2016-10-14 ENCOUNTER — Encounter: Payer: Self-pay | Admitting: Internal Medicine

## 2016-10-14 ENCOUNTER — Ambulatory Visit (INDEPENDENT_AMBULATORY_CARE_PROVIDER_SITE_OTHER): Payer: Medicare HMO | Admitting: Internal Medicine

## 2016-10-14 VITALS — BP 128/78 | HR 68 | Ht 65.0 in | Wt 150.2 lb

## 2016-10-14 DIAGNOSIS — K5190 Ulcerative colitis, unspecified, without complications: Secondary | ICD-10-CM | POA: Diagnosis not present

## 2016-10-14 DIAGNOSIS — D123 Benign neoplasm of transverse colon: Secondary | ICD-10-CM

## 2016-10-14 DIAGNOSIS — R195 Other fecal abnormalities: Secondary | ICD-10-CM | POA: Diagnosis not present

## 2016-10-14 DIAGNOSIS — K219 Gastro-esophageal reflux disease without esophagitis: Secondary | ICD-10-CM

## 2016-10-14 NOTE — Patient Instructions (Signed)
We have sent the following medications to your pharmacy for you to pick up at your convenience:  Lialda  Please follow up in one year  

## 2016-10-14 NOTE — Progress Notes (Signed)
HISTORY OF PRESENT ILLNESS:  Rhonda Woods is a 71 y.o. female with a history of GERD, IBS, and anxiety who was evaluated in the office 05/17/2016 for rectal bleeding and Hemoccult-positive stool. She subsequently underwent colonoscopy which revealed idiopathic proctosigmoiditis. She was treated with a combination of topical and oral mesalamine. She was seen in the office 07/02/2016 and was doing well. She continued topical therapy for 1 additional month as instructed. Continued to do well thus discontinued topical therapy but continued on oral mesalamine in the form of Lialda 2.4 g daily she presents today for scheduled follow-up. Patient is pleased report that she continues to do well. No abdominal pain. Describes 2-3 formed bowel movements per day without bleeding. She is expecting a new grandchild  REVIEW OF SYSTEMS:  All non-GI ROS negative except for anxiety, depression  Past Medical History:  Diagnosis Date  . Adrenal mass, right ([place])   . Anxiety   . Depression   . Diverticulosis   . GERD (gastroesophageal reflux disease)   . Hyperlipidemia   . IBS (irritable bowel syndrome)   . Osteoporosis   . UTI (urinary tract infection)     Past Surgical History:  Procedure Laterality Date  . BREAST SURGERY Left    cyst removal  . cataracts      Social History [name]  reports that she quit smoking about 9 years ago. Her smoking use included Cigarettes. She has never used smokeless tobacco. She reports that she drinks alcohol. Her drug history is not on file.  family history includes Bladder Cancer in her brother; CVA in her mother; Dementia in her brother; Diabetes in her father; Hypertension in her mother; Parkinson's disease in her father.  Allergies  Allergen Reactions  . Demerol [Meperidine]   . Hydrocodone-Acetaminophen     REACTION: spinning  . Meperidine Hcl     REACTION: hives  . Vicodin [Hydrocodone-Acetaminophen]        PHYSICAL EXAMINATION: Vital signs: BP  128/78   Pulse 68   Ht 5\' 5"  (1.651 m)   Wt 150 lb 3.2 oz (68.1 kg)   SpO2 98%   BMI 24.99 kg/m   Constitutional: generally well-appearing, no acute distress Psychiatric: alert and oriented x3, cooperative Eyes: extraocular movements intact, anicteric, conjunctiva pink Mouth: oral pharynx moist, no lesions Neck: supple no lymphadenopathy Cardiovascular: heart regular rate and rhythm, no murmur Lungs: clear to auscultation bilaterally Abdomen: soft, nontender, nondistended, no obvious ascites, no peritoneal signs, normal bowel sounds, no organomegaly Rectal:Omitted Extremities: no clubbing, cyanosis, or lower extremity edema bilaterally Skin: no lesions on visible extremities Neuro: No focal deficits. Cranial nerves intact  ASSESSMENT:  #1. Idiopathic ulcerative proctosigmoiditis. Asymptomatic on oral mesalamine #2. GERD. Asymptomatic on PPI #3. History of adenomatous colon polyp on most recent colonoscopy   PLAN:  #1. Continue Lialda 2.4 g daily. Prescribed #2. Reflux Precautions #3. Continue PPI #4. Routine office follow-up one year. Advised to contact the office in the interim for any questions or problems #5. Surveillance colonoscopy around April 2023  15 minutes spent face-to-face with the patient. Greater than 50% a time use for counseling regarding her chronic ulcerative proctosigmoiditis, its treatment, and follow-up

## 2016-10-23 ENCOUNTER — Other Ambulatory Visit: Payer: Self-pay | Admitting: Internal Medicine

## 2016-10-23 DIAGNOSIS — Z1231 Encounter for screening mammogram for malignant neoplasm of breast: Secondary | ICD-10-CM

## 2016-11-15 ENCOUNTER — Encounter: Payer: Self-pay | Admitting: Internal Medicine

## 2016-12-04 ENCOUNTER — Encounter: Payer: Self-pay | Admitting: Internal Medicine

## 2016-12-06 ENCOUNTER — Telehealth: Payer: Self-pay | Admitting: Internal Medicine

## 2016-12-10 ENCOUNTER — Telehealth: Payer: Self-pay | Admitting: Internal Medicine

## 2016-12-11 ENCOUNTER — Telehealth: Payer: Self-pay

## 2016-12-11 NOTE — Telephone Encounter (Signed)
Apriso 0.375 mg; #60; take 2 daily; 11 refills  Please document any changes in a phone note so that it may be referred to at the time of her next office evaluation. Thank you

## 2016-12-11 NOTE — Telephone Encounter (Signed)
See note dated 11/21

## 2016-12-11 NOTE — Telephone Encounter (Signed)
Spoke with [facility] who stated patient was in the donut hole and the only cheaper alternative would be sulfasalazine.  Spoke to patient who said she had gotten a letter that stated Lialda would no longer be covered.  Patient is going to contact insurance company to clarify what they will cover and then call me back.

## 2016-12-11 NOTE — Telephone Encounter (Signed)
Patient has received notifcation from her insurance company that starting January 1 they will no longer cover her Lialda.  The alternatives they offer are:  Sulfasalazine, Apriso, Balsalazide.  Please advise if any of these are possibilities.

## 2016-12-16 ENCOUNTER — Telehealth: Payer: Self-pay | Admitting: Internal Medicine

## 2016-12-16 MED ORDER — MESALAMINE ER 0.375 G PO CP24: 375.0000 mg | ORAL_CAPSULE | Freq: Two times a day (BID) | ORAL | 11 refills | Status: DC

## 2016-12-16 NOTE — Telephone Encounter (Signed)
Patient calling in regarding this. Best # [phone] patient states that she is almost out of medication

## 2016-12-16 NOTE — Telephone Encounter (Signed)
Spoke with patient and told her that per Dr. Henrene Pastor she could try Apriso instead of Lialda.  I sent to pharmacy and patient is going to call back with any problems.

## 2016-12-18 NOTE — Telephone Encounter (Signed)
1. I prefer she stay on Lialda as she is doing well in remission 2. I would not prescribe sulfasalazine as the side effect profile is quite high 3. She may try balsalazide capsules which are 750 mg each (colazal); take 3 capsules (2.25 g) twice daily; #180; multiple refills 4. If she has side effects or recurrent symptoms, contact the office

## 2016-12-18 NOTE — Telephone Encounter (Signed)
I sent Apriso to replace her Lialda that was no longer covered, however it is also cost prohibitive.  The other two alternatives are Sulfasalazine and Balsalazide.  Please advise.

## 2016-12-18 NOTE — Telephone Encounter (Signed)
Gave patient the alternatives Dr. Henrene Pastor had approved to consider.  Patient has enough Lialda to last through the end of the year and is going to re-evaluate once she is out of the donut hole in January.  She will call with any problems before then.

## 2016-12-31 ENCOUNTER — Ambulatory Visit: Payer: Medicare HMO

## 2017-01-09 ENCOUNTER — Encounter: Payer: Self-pay | Admitting: Internal Medicine

## 2017-01-31 ENCOUNTER — Ambulatory Visit
Admission: RE | Admit: 2017-01-31 | Discharge: 2017-01-31 | Disposition: A | Discharge status: Home / Self Care | Payer: Medicare HMO | Source: Ambulatory Visit | Attending: Internal Medicine | Admitting: Internal Medicine

## 2017-01-31 DIAGNOSIS — Z1231 Encounter for screening mammogram for malignant neoplasm of breast: Secondary | ICD-10-CM | POA: Diagnosis not present

## 2017-02-13 ENCOUNTER — Telehealth: Payer: Self-pay | Admitting: Internal Medicine

## 2017-02-13 ENCOUNTER — Encounter: Payer: Self-pay | Admitting: Internal Medicine

## 2017-02-16 ENCOUNTER — Encounter: Payer: Self-pay | Admitting: Internal Medicine

## 2017-02-17 ENCOUNTER — Telehealth: Payer: Self-pay

## 2017-02-17 NOTE — Telephone Encounter (Signed)
Faxed information requested by [facility] for Lialda prior authorization.

## 2017-02-18 MED ORDER — BALSALAZIDE DISODIUM 750 MG PO CAPS: 2250.0000 mg | ORAL_CAPSULE | Freq: Two times a day (BID) | ORAL | 3 refills | Status: DC

## 2017-02-18 NOTE — Telephone Encounter (Signed)
Prior authorization for Lialda denied.  Offered to send Balsalazide that Dr. Henrene Pastor had offered in November.

## 2017-02-18 NOTE — Telephone Encounter (Signed)
Sent Balsalazide per patient's request.  If this does not work she will let me know

## 2017-02-24 ENCOUNTER — Telehealth: Payer: Self-pay

## 2017-02-24 DIAGNOSIS — I10 Essential (primary) hypertension: Secondary | ICD-10-CM | POA: Diagnosis not present

## 2017-02-24 DIAGNOSIS — R82998 Other abnormal findings in urine: Secondary | ICD-10-CM | POA: Diagnosis not present

## 2017-02-24 DIAGNOSIS — M810 Age-related osteoporosis without current pathological fracture: Secondary | ICD-10-CM | POA: Diagnosis not present

## 2017-02-24 DIAGNOSIS — E7849 Other hyperlipidemia: Secondary | ICD-10-CM | POA: Diagnosis not present

## 2017-02-24 DIAGNOSIS — R7301 Impaired fasting glucose: Secondary | ICD-10-CM | POA: Diagnosis not present

## 2017-02-24 NOTE — Telephone Encounter (Signed)
Unexpectedly received an approval for the Lialda appeal I had sent [employer].  Called pharmacy to see if this made any difference in the price.  I was told it is too early to refill, can refill on 03/06/2017.  I will call them back at that time to find out the price of the Lialda.  Communicated this information to patient.  Patient aware.

## 2017-02-25 ENCOUNTER — Encounter: Payer: Self-pay | Admitting: Internal Medicine

## 2017-02-26 ENCOUNTER — Telehealth: Payer: Self-pay | Admitting: Internal Medicine

## 2017-02-26 ENCOUNTER — Telehealth: Payer: Self-pay

## 2017-02-26 NOTE — Telephone Encounter (Signed)
Patient's insurance continued to deny her Lialda.  When I checked with you in November you had said she could try Balsazide so I sent some in.  Insurance then unexpectedly approved the appeal I did for Lialda so she may be able to start that back in the next 2 weeks.  In the meantime she sent the following question:  I read the side effects of Balsalazide. I have been taking it since Saturday. The past few days my urine is very dark in color. Real dark yellow almost orange. I know you know that we are making a decision about this drug but, that's the only side effect. Let me know, I will do what you think is best. Thanks for your time. Wiktoria Schrupp     Please advise.

## 2017-02-26 NOTE — Telephone Encounter (Signed)
Patient calling back stating that she cannot take the balsalazide. Reports her urine is very dark orange and she "just doesn't feel right." She has only been taking it since Saturday. Please advise.

## 2017-02-26 NOTE — Telephone Encounter (Signed)
Lialda as previous then

## 2017-02-26 NOTE — Telephone Encounter (Signed)
May be the dye from the medication. If so, no worries. To be certain, we could check her liver tests and a urinalysis to rule out other causes for dark urine

## 2017-02-26 NOTE — Telephone Encounter (Signed)
Pt aware.

## 2017-02-26 NOTE — Telephone Encounter (Signed)
Sent patient a message relaying what Dr. Henrene Pastor said asking her to let me know if she would like me to order the tests he mentioned if it would make her feel better.

## 2017-02-28 NOTE — Telephone Encounter (Signed)
Waiting to see what the cost of Lialda will be.

## 2017-03-03 DIAGNOSIS — K219 Gastro-esophageal reflux disease without esophagitis: Secondary | ICD-10-CM | POA: Diagnosis not present

## 2017-03-03 DIAGNOSIS — F411 Generalized anxiety disorder: Secondary | ICD-10-CM | POA: Diagnosis not present

## 2017-03-03 DIAGNOSIS — E7849 Other hyperlipidemia: Secondary | ICD-10-CM | POA: Diagnosis not present

## 2017-03-03 DIAGNOSIS — R82998 Other abnormal findings in urine: Secondary | ICD-10-CM | POA: Diagnosis not present

## 2017-03-03 DIAGNOSIS — N390 Urinary tract infection, site not specified: Secondary | ICD-10-CM | POA: Diagnosis not present

## 2017-03-03 DIAGNOSIS — R7301 Impaired fasting glucose: Secondary | ICD-10-CM | POA: Diagnosis not present

## 2017-03-03 DIAGNOSIS — I10 Essential (primary) hypertension: Secondary | ICD-10-CM | POA: Diagnosis not present

## 2017-03-03 DIAGNOSIS — K5790 Diverticulosis of intestine, part unspecified, without perforation or abscess without bleeding: Secondary | ICD-10-CM | POA: Diagnosis not present

## 2017-03-03 DIAGNOSIS — K5190 Ulcerative colitis, unspecified, without complications: Secondary | ICD-10-CM | POA: Diagnosis not present

## 2017-03-03 DIAGNOSIS — Z0000 Encounter for general adult medical examination without abnormal findings: Secondary | ICD-10-CM | POA: Diagnosis not present

## 2017-03-07 ENCOUNTER — Telehealth: Payer: Self-pay

## 2017-03-07 DIAGNOSIS — R195 Other fecal abnormalities: Secondary | ICD-10-CM

## 2017-03-07 DIAGNOSIS — D123 Benign neoplasm of transverse colon: Secondary | ICD-10-CM

## 2017-03-07 DIAGNOSIS — K5100 Ulcerative (chronic) pancolitis without complications: Secondary | ICD-10-CM

## 2017-03-07 MED ORDER — MESALAMINE 1.2 G PO TBEC
DELAYED_RELEASE_TABLET | ORAL | 6 refills | Status: DC
Start: 2017-03-07 — End: 2017-10-09

## 2017-03-07 NOTE — Telephone Encounter (Signed)
Called [facility] to check price of newly approved Lialda.  Lialda is $95 which is less expensive than Balsalazide.   Spoke with patient who requested I go ahead and send a Lialda rx to the pharmacy.  Rx sent to pharmacy

## 2017-03-26 DIAGNOSIS — Z1212 Encounter for screening for malignant neoplasm of rectum: Secondary | ICD-10-CM | POA: Diagnosis not present

## 2017-04-16 DIAGNOSIS — Z6824 Body mass index (BMI) 24.0-24.9, adult: Secondary | ICD-10-CM | POA: Diagnosis not present

## 2017-04-16 DIAGNOSIS — J069 Acute upper respiratory infection, unspecified: Secondary | ICD-10-CM | POA: Diagnosis not present

## 2017-04-16 DIAGNOSIS — I10 Essential (primary) hypertension: Secondary | ICD-10-CM | POA: Diagnosis not present

## 2017-04-16 DIAGNOSIS — J029 Acute pharyngitis, unspecified: Secondary | ICD-10-CM | POA: Diagnosis not present

## 2017-04-16 DIAGNOSIS — K5190 Ulcerative colitis, unspecified, without complications: Secondary | ICD-10-CM | POA: Diagnosis not present

## 2017-05-01 ENCOUNTER — Emergency Department (HOSPITAL_COMMUNITY)
Admission: EM | Admit: 2017-05-01 | Discharge: 2017-05-01 | Disposition: A | Discharge status: Home / Self Care | Payer: Medicare HMO | Attending: Emergency Medicine | Admitting: Emergency Medicine

## 2017-05-01 ENCOUNTER — Encounter (HOSPITAL_COMMUNITY): Payer: Self-pay

## 2017-05-01 ENCOUNTER — Emergency Department (HOSPITAL_COMMUNITY): Payer: Medicare HMO

## 2017-05-01 DIAGNOSIS — R630 Anorexia: Secondary | ICD-10-CM | POA: Diagnosis not present

## 2017-05-01 DIAGNOSIS — N3001 Acute cystitis with hematuria: Secondary | ICD-10-CM | POA: Diagnosis not present

## 2017-05-01 DIAGNOSIS — R1110 Vomiting, unspecified: Secondary | ICD-10-CM | POA: Diagnosis not present

## 2017-05-01 DIAGNOSIS — R5381 Other malaise: Secondary | ICD-10-CM | POA: Diagnosis not present

## 2017-05-01 DIAGNOSIS — R5383 Other fatigue: Secondary | ICD-10-CM | POA: Diagnosis not present

## 2017-05-01 LAB — CBC WITH DIFFERENTIAL/PLATELET
BASOS PCT: 0 %
Basophils Absolute: 0 10*3/uL (ref 0.0–0.1)
Eosinophils Absolute: 0 10*3/uL (ref 0.0–0.7)
Eosinophils Relative: 0 %
HCT: 38.9 % (ref 36.0–46.0)
HEMOGLOBIN: 13.1 g/dL (ref 12.0–15.0)
Lymphocytes Relative: 3 %
Lymphs Abs: 0.4 10*3/uL — ABNORMAL LOW (ref 0.7–4.0)
MCH: 30.3 pg (ref 26.0–34.0)
MCHC: 33.7 g/dL (ref 30.0–36.0)
MCV: 90 fL (ref 78.0–100.0)
MONO ABS: 0.7 10*3/uL (ref 0.1–1.0)
MONOS PCT: 5 %
NEUTROS PCT: 92 %
Neutro Abs: 12.1 10*3/uL — ABNORMAL HIGH (ref 1.7–7.7)
Platelets: 285 10*3/uL (ref 150–400)
RBC: 4.32 MIL/uL (ref 3.87–5.11)
RDW: 13 % (ref 11.5–15.5)
WBC: 13.3 10*3/uL — ABNORMAL HIGH (ref 4.0–10.5)

## 2017-05-01 LAB — URINALYSIS, ROUTINE W REFLEX MICROSCOPIC
BILIRUBIN URINE: NEGATIVE
Glucose, UA: NEGATIVE mg/dL
KETONES UR: NEGATIVE mg/dL
NITRITE: POSITIVE — AB
Protein, ur: >=300 mg/dL — AB
SPECIFIC GRAVITY, URINE: 1.018 (ref 1.005–1.030)
pH: 6 (ref 5.0–8.0)

## 2017-05-01 LAB — BASIC METABOLIC PANEL
Anion gap: 13 (ref 5–15)
BUN: 31 mg/dL — ABNORMAL HIGH (ref 6–20)
CHLORIDE: 99 mmol/L — AB (ref 101–111)
CO2: 24 mmol/L (ref 22–32)
CREATININE: 1.14 mg/dL — AB (ref 0.44–1.00)
Calcium: 9.1 mg/dL (ref 8.9–10.3)
GFR calc non Af Amer: 47 mL/min — ABNORMAL LOW (ref >60)
GFR, EST AFRICAN AMERICAN: 54 mL/min — AB (ref >60)
Glucose, Bld: 164 mg/dL — ABNORMAL HIGH (ref 65–99)
Potassium: 3.5 mmol/L (ref 3.5–5.1)
Sodium: 136 mmol/L (ref 135–145)

## 2017-05-01 MED ORDER — ONDANSETRON 4 MG PO TBDP: 4.0000 mg | ORAL_TABLET | Freq: Three times a day (TID) | ORAL | 0 refills | Status: DC | PRN

## 2017-05-01 MED ORDER — ONDANSETRON HCL 4 MG/2ML IJ SOLN
4.0000 mg | Freq: Once | INTRAMUSCULAR | Status: AC
  Administered 2017-05-01: 4 mg via INTRAVENOUS
  Filled 2017-05-01: qty 2

## 2017-05-01 MED ORDER — SODIUM CHLORIDE 0.9 % IV BOLUS
1000.0000 mL | Freq: Once | INTRAVENOUS | Status: AC
  Administered 2017-05-01: 1000 mL via INTRAVENOUS

## 2017-05-01 MED ORDER — CEPHALEXIN 500 MG PO CAPS: 500.0000 mg | ORAL_CAPSULE | Freq: Two times a day (BID) | ORAL | 0 refills | Status: AC

## 2017-05-01 NOTE — ED Notes (Signed)
Pt able to keep water down.  

## 2017-05-01 NOTE — Discharge Instructions (Signed)
You have a urinary tract infection. Take antibiotic as prescribed and until completed. Zofran for nausea. Stay well hydrated 1-2L of water daily and/or until urine is clear. Dietary supplemental shakes such as Ensure can help throughout the day. There is always a risk of worsening infection to kidneys, return for abdominal pain, flank pain, fever, vomiting.

## 2017-05-01 NOTE — ED Triage Notes (Addendum)
Pt presents with feelings of general malaise, chills, and loss of appetite for approx one week. Pt also reports vomiting. Pt also reports feelings of depression, denies any thoughts of SI/HI.

## 2017-05-01 NOTE — ED Provider Notes (Addendum)
[facility] DEPT Provider Note   CSN: [id number] Arrival date & time: 05/01/17  0600     History   Chief Complaint Chief Complaint  Patient presents with  . Fatigue  . Depression    HPI Rhonda Woods is a 72 y.o. female history of GERD, ulcerative colitis, frequent UTIs, anxiety, depression here for evaluation of nausea, chills, sweats, nausea, dry heaving, generalized malaise, fatigue, decreased appetite, feeling more "pissy" x 4 days. Initially thought it may have just been depression because she has felt less energy to do think during the day. Decreased food intake in the last 2 days, yesterday she had half a bowl of soup and the day before and just half toast. Typically she has good appetite. Has not taken any of his medications and symptom onset 4 days ago. Denies frank fever, cold symptoms, cough, chest pain, shortness of breath, abdominal pain, vomiting, urinary symptoms. States she typically does not get symptoms with UTIs, last one was 2 months ago. Usually has normal, formed stools 2 days ago had diarrhea, no obvious blood.   HPI  Past Medical History:  Diagnosis Date  . Adrenal mass, right ([place])   . Anxiety   . Depression   . Diverticulosis   . GERD (gastroesophageal reflux disease)   . Hyperlipidemia   . IBS (irritable bowel syndrome)   . Osteoporosis   . UTI (urinary tract infection)     There are no active problems to display for this patient.   Past Surgical History:  Procedure Laterality Date  . BREAST SURGERY Left    cyst removal  . cataracts       OB History   None      Home Medications    Prior to Admission medications   Medication Sig Start Date End Date Taking? Authorizing Provider  alendronate (FOSAMAX) 70 MG tablet Take 70 mg by mouth once a week. Take with a full glass of water on an empty stomach.   Yes [provider]  ALPRAZolam Duanne Moron) 0.5 MG tablet Take 0.5-1 mg by mouth every 6 (six) hours.    Yes [provider]  atenolol (TENORMIN) 50 MG tablet Take 50 mg by mouth daily.   Yes [provider]  busPIRone (BUSPAR) 15 MG tablet Take 15 mg by mouth 2 (two) times daily.   Yes [provider]  Calcium Carbonate (CALCIUM 600 PO) Take 1 tablet by mouth 2 (two) times daily.   Yes [provider]  mesalamine (LIALDA) 1.2 g EC tablet Take 2 tablets by mouth each morning. 03/07/17  Yes Irene Shipper, MD  omeprazole (PRILOSEC) 20 MG capsule Take 20 mg by mouth daily.   Yes [provider]  sertraline (ZOLOFT) 100 MG tablet Take 100 mg by mouth daily.   Yes [provider]  verapamil (VERELAN PM) 240 MG 24 hr capsule Take 240 mg by mouth at bedtime.   Yes [provider]  balsalazide (COLAZAL) 750 MG capsule Take 3 capsules (2,250 mg total) by mouth 2 (two) times daily. Patient not taking: Reported on 05/01/2017 02/18/17   Irene Shipper, MD  cephALEXin (KEFLEX) 500 MG capsule Take 1 capsule (500 mg total) by mouth 2 (two) times daily for 7 days. 05/01/17 05/08/17  Kinnie Feil, PA-C  mesalamine (APRISO) 0.375 g 24 hr capsule Take 1 capsule (0.375 g total) by mouth 2 (two) times daily. Patient not taking: Reported on 05/01/2017 12/16/16   Irene Shipper, MD  ondansetron (ZOFRAN ODT) 4 MG disintegrating tablet Take 1 tablet (4 mg total) by mouth every 8 (eight) hours as needed for nausea or vomiting. 05/01/17   Kinnie Feil, PA-C    Family History Family History  Problem Relation Age of Onset  . Hypertension Mother   . CVA Mother   . Parkinson's disease Father   . Diabetes Father   . Dementia Brother   . Bladder Cancer Brother   . Colon cancer Neg Hx     Social History Social History   Tobacco Use  . Smoking status: Former Smoker    Types: Cigarettes    Last attempt to quit: 04/13/2007    Years since quitting: 10.0  . Smokeless tobacco: Never Used  Substance Use Topics  . Alcohol use: Yes  . Drug use: Not on file      Allergies   Demerol [meperidine]; Hydrocodone-acetaminophen; Meperidine hcl; and Vicodin [hydrocodone-acetaminophen]   Review of Systems Review of Systems  Constitutional: Positive for activity change, appetite change, chills, diaphoresis and fatigue.  Psychiatric/Behavioral:       Low energy   All other systems reviewed and are negative.   Physical Exam Updated Vital Signs BP 132/77   Pulse 73   Temp 98 F (36.7 C) (Oral)   Resp 16   Ht 5\' 5"  (1.651 m)   Wt 68 kg (150 lb)   SpO2 96%   BMI 24.96 kg/m   Physical Exam  Constitutional: She is oriented to person, place, and time. She appears well-developed and well-nourished. No distress.  Non toxic  HENT:  Head: Normocephalic and atraumatic.  Nose: Nose normal.  Mouth/Throat: No oropharyngeal exudate.  Top of her tongue is dry. No nasal mucosal edema.  Eyes: Pupils are equal, round, and reactive to light. Conjunctivae and EOM are normal.  Neck: Normal range of motion.  No cervical adenopathy  Cardiovascular: Normal rate, regular rhythm and intact distal pulses.  No murmur heard. 2+ DP and radial pulses bilaterally. No LE edema.   Pulmonary/Chest: Effort normal and breath sounds normal. No respiratory distress. She has no wheezes. She has no rales.  Abdominal: Soft. Bowel sounds are normal. There is no tenderness.  No G/R/R. No suprapubic or CVA tenderness.   Musculoskeletal: Normal range of motion. She exhibits no deformity.  Neurological: She is alert and oriented to person, place, and time.  Skin: Skin is warm and dry. Capillary refill takes less than 2 seconds.  Psychiatric: She has a normal mood and affect. Her behavior is normal. Judgment and thought content normal.  Nursing note and vitals reviewed.    ED Treatments / Results  Labs (all labs ordered are listed, but only abnormal results are displayed) Labs Reviewed  CBC WITH DIFFERENTIAL/PLATELET - Abnormal; Notable for the following components:       Result Value   WBC 13.3 (*)    Neutro Abs 12.1 (*)    Lymphs Abs 0.4 (*)    All other components within normal limits  BASIC METABOLIC PANEL - Abnormal; Notable for the following components:   Chloride 99 (*)    Glucose, Bld 164 (*)    BUN 31 (*)    Creatinine, Ser 1.14 (*)    GFR calc non Af Amer 47 (*)    GFR calc Af Amer 54 (*)    All other components within normal limits  URINALYSIS, ROUTINE W REFLEX MICROSCOPIC - Abnormal; Notable for the following components:   Hgb urine dipstick MODERATE (*)  Protein, ur >=300 (*)    Nitrite POSITIVE (*)    Leukocytes, UA TRACE (*)    Bacteria, UA MANY (*)    Squamous Epithelial / LPF 0-5 (*)    All other components within normal limits  URINE CULTURE    EKG None  Radiology Dg Chest 2 View  Result Date: 05/01/2017 CLINICAL DATA:  General malaise. Chills. Loss of appetite. Vomiting. EXAM: CHEST - 2 VIEW COMPARISON:  No recent prior. FINDINGS: Mediastinum hilar structures normal. Heart size normal. No pulmonary venous congestion. Calcified pulmonary nodules right mid lung. These are consistent granulomas. No acute infiltrate. No pleural effusion or pneumothorax. Degenerative change thoracic spine. IMPRESSION: No acute cardiopulmonary disease. Electronically Signed   By: Marcello Moores  Register   On: 05/01/2017 07:48    Procedures Procedures (including critical care time)  Medications Ordered in ED Medications  sodium chloride 0.9 % bolus 1,000 mL (0 mLs Intravenous Stopped 05/01/17 0745)  ondansetron (ZOFRAN) injection 4 mg (4 mg Intravenous Given 05/01/17 0728)     Initial Impression / Assessment and Plan / ED Course  I have reviewed the triage vital signs and the nursing notes.  Pertinent labs & imaging results that were available during my care of the patient were reviewed by me and considered in my medical decision making (see chart for details).  Clinical Course as of May 02 822  Thu May 01, 2017  0749 Creatinine(!): 1.14 [CG]    0749 BUN(!): 31 [CG]  0749 GFR, Est Non African American(!): 47 [CG]  0749 WBC(!): 13.3 [CG]  0804 Hgb urine dipstickMarland Kitchen): MODERATE [CG]  0804 Nitrite(!): POSITIVE [CG]  0805 WBC, UA: 6-30 [CG]  0805 Bacteria, UA(!): MANY [CG]    Clinical Course User Index [CG] Kinnie Feil, PA-C   72 year old with ulcerative colitis here for chills, generalized malaise, decreased appetite, decreased by mouth intake without obvious infectious symptoms. On exam, she has clammy but nontoxic, afebrile. Exam is above noncontributory, does look slightly dry. We'll initiate screening labs, urinalysis, chest x-ray to rule out occult infection. 1 L IV fluids started. Final Clinical Impressions(s) / ED Diagnoses   0820: ED lab work and imaging reviewed and remarkable for UTI, WBC 13.3, creatinine 1.14. She has had no fever. CXR negative.  Re-evaluated pt who has had no episodes of emesis in ED. Tolerating PO and ambulatory without difficulty. Will dc with abx, zofran, oral hydration, close f/u with PCP in 48 hours for persistent symptoms. Discussed return precautions. She has f/u with therapist for depression in 1 week. Pt shared with Dr. Jeneen Rinks.  Final diagnoses:  Acute cystitis with hematuria    ED Discharge Orders        Ordered    cephALEXin (KEFLEX) 500 MG capsule  2 times daily     05/01/17 0820    ondansetron (ZOFRAN ODT) 4 MG disintegrating tablet  Every 8 hours PRN     05/01/17 0820       Kinnie Feil, PA-C 05/01/17 0825    Tanna Furry, MD 05/03/17 [phone]

## 2017-05-01 NOTE — ED Notes (Signed)
Pt ambulatory to restroom

## 2017-05-02 DIAGNOSIS — J111 Influenza due to unidentified influenza virus with other respiratory manifestations: Secondary | ICD-10-CM | POA: Diagnosis not present

## 2017-05-02 DIAGNOSIS — N390 Urinary tract infection, site not specified: Secondary | ICD-10-CM | POA: Diagnosis not present

## 2017-05-02 DIAGNOSIS — Z6823 Body mass index (BMI) 23.0-23.9, adult: Secondary | ICD-10-CM | POA: Diagnosis not present

## 2017-05-02 DIAGNOSIS — R509 Fever, unspecified: Secondary | ICD-10-CM | POA: Diagnosis not present

## 2017-05-03 LAB — URINE CULTURE

## 2017-05-04 ENCOUNTER — Telehealth: Payer: Self-pay

## 2017-05-04 NOTE — Telephone Encounter (Signed)
Post ED Visit - Positive Culture Follow-up  Culture report reviewed by antimicrobial stewardship pharmacist:  []  Elenor Quinones, Pharm.D. []  Heide Guile, Pharm.D., BCPS AQ-ID []  Parks Neptune, Pharm.D., BCPS []  Alycia Rossetti, Pharm.D., BCPS []  [place], Pharm.D., BCPS, AAHIVP []  Legrand Como, Pharm.D., BCPS, AAHIVP []  Salome Arnt, PharmD, BCPS []  Jalene Mullet, PharmD []  Vincenza Hews, PharmD, BCPS Jimmy Footman Pharm D Positive urine culture Treated with Cephalexin, organism sensitive to the same and no further patient follow-up is required at this time.  Genia Del 05/04/2017, 10:01 AM

## 2017-05-07 DIAGNOSIS — F418 Other specified anxiety disorders: Secondary | ICD-10-CM | POA: Diagnosis not present

## 2017-05-08 DIAGNOSIS — F418 Other specified anxiety disorders: Secondary | ICD-10-CM | POA: Diagnosis not present

## 2017-05-08 DIAGNOSIS — N390 Urinary tract infection, site not specified: Secondary | ICD-10-CM | POA: Diagnosis not present

## 2017-05-08 DIAGNOSIS — I10 Essential (primary) hypertension: Secondary | ICD-10-CM | POA: Diagnosis not present

## 2017-05-08 DIAGNOSIS — Z6823 Body mass index (BMI) 23.0-23.9, adult: Secondary | ICD-10-CM | POA: Diagnosis not present

## 2017-05-12 DIAGNOSIS — N390 Urinary tract infection, site not specified: Secondary | ICD-10-CM | POA: Diagnosis not present

## 2017-05-21 DIAGNOSIS — F418 Other specified anxiety disorders: Secondary | ICD-10-CM | POA: Diagnosis not present

## 2017-05-29 DIAGNOSIS — N390 Urinary tract infection, site not specified: Secondary | ICD-10-CM | POA: Diagnosis not present

## 2017-05-29 DIAGNOSIS — R509 Fever, unspecified: Secondary | ICD-10-CM | POA: Diagnosis not present

## 2017-06-04 DIAGNOSIS — F418 Other specified anxiety disorders: Secondary | ICD-10-CM | POA: Diagnosis not present

## 2017-06-18 DIAGNOSIS — F418 Other specified anxiety disorders: Secondary | ICD-10-CM | POA: Diagnosis not present

## 2017-07-09 DIAGNOSIS — F418 Other specified anxiety disorders: Secondary | ICD-10-CM | POA: Diagnosis not present

## 2017-07-15 DIAGNOSIS — N390 Urinary tract infection, site not specified: Secondary | ICD-10-CM | POA: Diagnosis not present

## 2017-08-06 DIAGNOSIS — F418 Other specified anxiety disorders: Secondary | ICD-10-CM | POA: Diagnosis not present

## 2017-08-06 DIAGNOSIS — Z6824 Body mass index (BMI) 24.0-24.9, adult: Secondary | ICD-10-CM | POA: Diagnosis not present

## 2017-08-06 DIAGNOSIS — R7301 Impaired fasting glucose: Secondary | ICD-10-CM | POA: Diagnosis not present

## 2017-08-06 DIAGNOSIS — N390 Urinary tract infection, site not specified: Secondary | ICD-10-CM | POA: Diagnosis not present

## 2017-08-06 DIAGNOSIS — I10 Essential (primary) hypertension: Secondary | ICD-10-CM | POA: Diagnosis not present

## 2017-08-12 DIAGNOSIS — N200 Calculus of kidney: Secondary | ICD-10-CM | POA: Diagnosis not present

## 2017-08-12 DIAGNOSIS — E279 Disorder of adrenal gland, unspecified: Secondary | ICD-10-CM | POA: Diagnosis not present

## 2017-08-12 DIAGNOSIS — N390 Urinary tract infection, site not specified: Secondary | ICD-10-CM | POA: Diagnosis not present

## 2017-08-13 DIAGNOSIS — F418 Other specified anxiety disorders: Secondary | ICD-10-CM | POA: Diagnosis not present

## 2017-09-04 DIAGNOSIS — F418 Other specified anxiety disorders: Secondary | ICD-10-CM | POA: Diagnosis not present

## 2017-10-04 DIAGNOSIS — Z23 Encounter for immunization: Secondary | ICD-10-CM | POA: Diagnosis not present

## 2017-10-06 DIAGNOSIS — F418 Other specified anxiety disorders: Secondary | ICD-10-CM | POA: Diagnosis not present

## 2017-10-09 ENCOUNTER — Telehealth: Payer: Self-pay

## 2017-10-09 DIAGNOSIS — K5100 Ulcerative (chronic) pancolitis without complications: Secondary | ICD-10-CM

## 2017-10-09 DIAGNOSIS — D123 Benign neoplasm of transverse colon: Secondary | ICD-10-CM

## 2017-10-09 DIAGNOSIS — R195 Other fecal abnormalities: Secondary | ICD-10-CM

## 2017-10-09 MED ORDER — MESALAMINE 1.2 G PO TBEC: DELAYED_RELEASE_TABLET | ORAL | 2 refills | Status: DC

## 2017-10-09 NOTE — Telephone Encounter (Signed)
Refilled Lialda.  Patient needs office visit

## 2017-11-03 DIAGNOSIS — F418 Other specified anxiety disorders: Secondary | ICD-10-CM | POA: Diagnosis not present

## 2017-12-01 DIAGNOSIS — F418 Other specified anxiety disorders: Secondary | ICD-10-CM | POA: Diagnosis not present

## 2017-12-26 ENCOUNTER — Other Ambulatory Visit: Payer: Self-pay | Admitting: Internal Medicine

## 2017-12-26 DIAGNOSIS — D123 Benign neoplasm of transverse colon: Secondary | ICD-10-CM

## 2017-12-26 DIAGNOSIS — K5100 Ulcerative (chronic) pancolitis without complications: Secondary | ICD-10-CM

## 2017-12-26 DIAGNOSIS — R195 Other fecal abnormalities: Secondary | ICD-10-CM

## 2018-01-12 DIAGNOSIS — F418 Other specified anxiety disorders: Secondary | ICD-10-CM | POA: Diagnosis not present

## 2018-02-02 ENCOUNTER — Other Ambulatory Visit: Payer: Self-pay | Admitting: Internal Medicine

## 2018-02-02 DIAGNOSIS — Z1231 Encounter for screening mammogram for malignant neoplasm of breast: Secondary | ICD-10-CM

## 2018-02-12 DIAGNOSIS — R82998 Other abnormal findings in urine: Secondary | ICD-10-CM | POA: Diagnosis not present

## 2018-02-12 DIAGNOSIS — N3281 Overactive bladder: Secondary | ICD-10-CM | POA: Diagnosis not present

## 2018-02-12 DIAGNOSIS — N390 Urinary tract infection, site not specified: Secondary | ICD-10-CM | POA: Diagnosis not present

## 2018-02-16 DIAGNOSIS — F418 Other specified anxiety disorders: Secondary | ICD-10-CM | POA: Diagnosis not present

## 2018-03-10 ENCOUNTER — Ambulatory Visit
Admission: RE | Admit: 2018-03-10 | Discharge: 2018-03-10 | Disposition: A | Discharge status: Home / Self Care | Payer: Medicare HMO | Source: Ambulatory Visit | Attending: Internal Medicine | Admitting: Internal Medicine

## 2018-03-10 DIAGNOSIS — Z1231 Encounter for screening mammogram for malignant neoplasm of breast: Secondary | ICD-10-CM

## 2018-03-11 ENCOUNTER — Telehealth: Payer: Self-pay | Admitting: Internal Medicine

## 2018-03-11 NOTE — Telephone Encounter (Signed)
My opinion is that these medications are not equivalent.  Sulfasalazine has a significantly higher side effect profile and represents a huge step backward versus current mesalamine therapies.  I would like her to stay on mesalamine therapy without change.  Thanks

## 2018-03-11 NOTE — Telephone Encounter (Signed)
Pt's insurance prefers sulfasalazine instead of mesalazine t.  Pt would like to know if Dr. Henrene Pastor approves.  Otherwise she is "very happy with the current med."

## 2018-03-11 NOTE — Telephone Encounter (Signed)
Spoke with patient and told her what Dr. Henrene Pastor had said about her medication.  Patient understood and will stay on current Mesalamine.

## 2018-03-11 NOTE — Telephone Encounter (Signed)
I think patient just wants your opinion.  If she needs to stay on current mesalamine I will just call and let her know.

## 2018-03-16 DIAGNOSIS — F418 Other specified anxiety disorders: Secondary | ICD-10-CM | POA: Diagnosis not present

## 2018-03-23 ENCOUNTER — Telehealth: Payer: Self-pay | Admitting: Internal Medicine

## 2018-03-23 DIAGNOSIS — D123 Benign neoplasm of transverse colon: Secondary | ICD-10-CM

## 2018-03-23 DIAGNOSIS — K5100 Ulcerative (chronic) pancolitis without complications: Secondary | ICD-10-CM

## 2018-03-23 DIAGNOSIS — R195 Other fecal abnormalities: Secondary | ICD-10-CM

## 2018-03-23 MED ORDER — MESALAMINE 1.2 G PO TBEC: DELAYED_RELEASE_TABLET | ORAL | 0 refills | Status: DC

## 2018-03-23 NOTE — Telephone Encounter (Signed)
Pt called to make appt for medication refill pt has appt with PA Amy Esterwood on 04/08/2018

## 2018-03-23 NOTE — Telephone Encounter (Signed)
Rx sent until appt with Nicoletta Ba, PA-C 04/08/18.

## 2018-04-06 MED ORDER — MESALAMINE 1.2 G PO TBEC: DELAYED_RELEASE_TABLET | ORAL | 0 refills | Status: DC

## 2018-04-06 NOTE — Telephone Encounter (Signed)
Pt called had to cancel appt for 04/08/2018 due to the fact her husband is in the hsp she did not sched on the phone because she was heading up to the hsp.

## 2018-04-06 NOTE — Addendum Note (Signed)
Addended by: Larina Bras on: 04/06/2018 04:21 PM   Modules accepted: Orders

## 2018-04-08 ENCOUNTER — Ambulatory Visit: Payer: Medicare HMO | Admitting: Physician Assistant

## 2018-06-17 ENCOUNTER — Telehealth: Payer: Self-pay | Admitting: Internal Medicine

## 2018-06-17 NOTE — Telephone Encounter (Signed)
Pt called in requesting to speak with nurse about refilling her medication. Pt is aware that she needs offc visit before refills is sent. Pt decline setting up an offc visit with me and wanted to speak with the nurse first.

## 2018-06-18 ENCOUNTER — Telehealth: Payer: Self-pay | Admitting: Internal Medicine

## 2018-06-18 ENCOUNTER — Other Ambulatory Visit: Payer: Self-pay

## 2018-06-18 DIAGNOSIS — K5100 Ulcerative (chronic) pancolitis without complications: Secondary | ICD-10-CM

## 2018-06-18 DIAGNOSIS — R195 Other fecal abnormalities: Secondary | ICD-10-CM

## 2018-06-18 DIAGNOSIS — D123 Benign neoplasm of transverse colon: Secondary | ICD-10-CM

## 2018-06-18 MED ORDER — MESALAMINE 1.2 G PO TBEC
DELAYED_RELEASE_TABLET | ORAL | 3 refills | Status: DC
Start: 2018-06-18 — End: 2018-07-14

## 2018-06-18 NOTE — Telephone Encounter (Signed)
Pt is sched for tele visit 07/14/2018 @3 :30p. Pt stated she only has 2 pills left.

## 2018-06-18 NOTE — Telephone Encounter (Signed)
Pt stated that she is doing great, so no reason for virtual visit.

## 2018-06-18 NOTE — Telephone Encounter (Signed)
Lm on vm relaying Dr. Blanch Media message about her appointment.  I told her anyone who answered the phone could schedule a telephone visit for her and that once they let me know, I would refill her Lialda

## 2018-06-18 NOTE — Telephone Encounter (Signed)
Sent in [place] rx

## 2018-06-18 NOTE — Telephone Encounter (Signed)
I am glad that she is doing well. However, my patients on medication for chronic IBD must be seen at least yearly. She is now 20+ months from her last visit (8+ months over due). Please arrange telehealth visit. Thank you

## 2018-06-18 NOTE — Telephone Encounter (Signed)
Patient was due for a one year follow up in 09/2017.  She was finally scheduled to see Amy in March so she could get her Lialda refilled which was cancelled because of the pandemic.  As you can see her response to my message was that she is doing well and doesn't need a phone visit.  Would you like me to let her go a little longer or tell her she has to schedule a phone visit at this time?  Thanks

## 2018-06-18 NOTE — Telephone Encounter (Signed)
Lm on vm that I can go ahead and send in a refill of Lialda and that she could plan to schedule a telephone visit with Dr. Henrene Pastor as her regular office visit.

## 2018-06-18 NOTE — Telephone Encounter (Signed)
Pt called again regarding this message from yesterday. Pls call her.

## 2018-06-30 DIAGNOSIS — F418 Other specified anxiety disorders: Secondary | ICD-10-CM | POA: Diagnosis not present

## 2018-07-09 ENCOUNTER — Telehealth: Payer: Self-pay

## 2018-07-09 NOTE — Telephone Encounter (Signed)
Lm on vm 

## 2018-07-13 NOTE — Telephone Encounter (Signed)
Phone screening complete 

## 2018-07-14 ENCOUNTER — Ambulatory Visit (INDEPENDENT_AMBULATORY_CARE_PROVIDER_SITE_OTHER): Payer: Medicare HMO | Admitting: Internal Medicine

## 2018-07-14 ENCOUNTER — Encounter: Payer: Self-pay | Admitting: Internal Medicine

## 2018-07-14 VITALS — Ht 65.0 in | Wt 146.0 lb

## 2018-07-14 DIAGNOSIS — R195 Other fecal abnormalities: Secondary | ICD-10-CM

## 2018-07-14 DIAGNOSIS — K219 Gastro-esophageal reflux disease without esophagitis: Secondary | ICD-10-CM

## 2018-07-14 DIAGNOSIS — D123 Benign neoplasm of transverse colon: Secondary | ICD-10-CM | POA: Diagnosis not present

## 2018-07-14 DIAGNOSIS — K5100 Ulcerative (chronic) pancolitis without complications: Secondary | ICD-10-CM

## 2018-07-14 DIAGNOSIS — K5190 Ulcerative colitis, unspecified, without complications: Secondary | ICD-10-CM

## 2018-07-14 DIAGNOSIS — K5130 Ulcerative (chronic) rectosigmoiditis without complications: Secondary | ICD-10-CM | POA: Diagnosis not present

## 2018-07-14 MED ORDER — MESALAMINE 1.2 G PO TBEC: DELAYED_RELEASE_TABLET | ORAL | 11 refills | Status: DC

## 2018-07-14 NOTE — Progress Notes (Signed)
HISTORY OF PRESENT ILLNESS:  Rhonda Woods is a 73 y.o. 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  She was last seen in the office October 14, 2016.  She presents today for her annual (greater than) follow-up via telehealth medicine during the coronavirus pandemic.  She also has a history of GERD which is managed with PPI therapy.  At the time of her last visit she was doing well on Lialda 2.4 g daily.  She has maintained compliance with that medication.  She is pleased to report that she is having regular bowel habits without bleeding or abdominal discomfort.  No appreciable medication side effects.  She does have regular blood work at her primary care provider's office and I told her this is been unremarkable.  No interval medical problems.  Please report the arrival of her granddaughter since our last visit.  REVIEW OF SYSTEMS:  All non-GI ROS negative unless otherwise stated in the HPI except for anxiety  Past Medical History:  Diagnosis Date  . Adrenal mass, right ([place])   . Anxiety   . Depression   . Diverticulosis   . GERD (gastroesophageal reflux disease)   . Hyperlipidemia   . IBS (irritable bowel syndrome)   . Osteoporosis   . UTI (urinary tract infection)     Past Surgical History:  Procedure Laterality Date  . BREAST SURGERY Left    cyst removal  . cataracts      Social History Rhonda Woods  reports that she quit smoking about 11 years ago. Her smoking use included cigarettes. She has never used smokeless tobacco. She reports current alcohol use. No history on file for drug.  family history includes Bladder Cancer in her brother; CVA in her mother; Dementia in her brother; Diabetes in her father; Hypertension in her mother; Parkinson's disease in her father.  Allergies  Allergen Reactions  . Demerol [Meperidine]   . Hydrocodone-Acetaminophen     REACTION: spinning   . Meperidine Hcl     REACTION: hives  . Vicodin [Hydrocodone-Acetaminophen]        PHYSICAL EXAMINATION: No physical examination with telehealth visit   ASSESSMENT:  1.  Idiopathic ulcerative proctosigmoiditis.  Asymptomatic on oral mesalamine in the form of Lialda 2.4 g daily 2.  GERD.  Asymptomatic on PPI 3.  History of adenomatous colon polyps with most recent colonoscopy.   PLAN:  1.  REFILL PRESCRIPTION for Lialda 2.4 g daily; 1.2 g tab; #60; take 2 daily; 11 refills.  Medication risks reviewed 2.  Continue annual blood work with PCP 3.  Continue PPI for GERD.  Lowest dose to pills symptoms recommended. 4.  Routine office follow-up here in 1 year.  She should contact the office in the interim for any questions or problems 5.  Surveillance colonoscopy anticipated around April 2023 This telehealth medicine visit was initiated by and consented for by patient.  She was in her home and I was in my office during the encounter.  She understands that there may be a charge for this professional service totaling 15 minutes

## 2018-07-14 NOTE — Addendum Note (Signed)
Addended by: Grace Bushy A on: 07/14/2018 04:06 PM   Modules accepted: Orders

## 2018-07-14 NOTE — Patient Instructions (Signed)
.    PRESCRIPTION for Lialda 2.4 g daily; 1.2 g tab; #60; take 2 daily; 11 refills.  2.  Continue annual blood work with PCP 3.  Continue PPI for GERD.  Lowest dose to pills symptoms recommended. 4.  Routine office follow-up here in 1 year.  She should contact the office in the interim for any questions or problems 5.  Surveillance colonoscopy anticipated around April 2023

## 2018-07-31 DIAGNOSIS — F418 Other specified anxiety disorders: Secondary | ICD-10-CM | POA: Diagnosis not present

## 2018-09-18 DIAGNOSIS — Z23 Encounter for immunization: Secondary | ICD-10-CM | POA: Diagnosis not present

## 2018-10-08 DIAGNOSIS — F418 Other specified anxiety disorders: Secondary | ICD-10-CM | POA: Diagnosis not present

## 2018-11-19 DIAGNOSIS — F418 Other specified anxiety disorders: Secondary | ICD-10-CM | POA: Diagnosis not present

## 2019-01-28 DIAGNOSIS — F418 Other specified anxiety disorders: Secondary | ICD-10-CM | POA: Diagnosis not present

## 2019-04-06 ENCOUNTER — Ambulatory Visit
Admission: RE | Admit: 2019-04-06 | Discharge: 2019-04-06 | Disposition: A | Discharge status: Home / Self Care | Payer: Medicare HMO | Source: Ambulatory Visit

## 2019-04-06 ENCOUNTER — Other Ambulatory Visit: Payer: Self-pay | Admitting: Internal Medicine

## 2019-04-06 ENCOUNTER — Other Ambulatory Visit: Payer: Self-pay

## 2019-04-06 DIAGNOSIS — Z1231 Encounter for screening mammogram for malignant neoplasm of breast: Secondary | ICD-10-CM | POA: Diagnosis not present

## 2019-05-18 IMAGING — CR DG CHEST 2V
2 series · 2 of 2 positions shown · non-contrast
Comparison: No recent prior.

CLINICAL DATA: General malaise. Chills. Loss of appetite. Vomiting.

EXAM:
CHEST - 2 VIEW

[w chest pa]
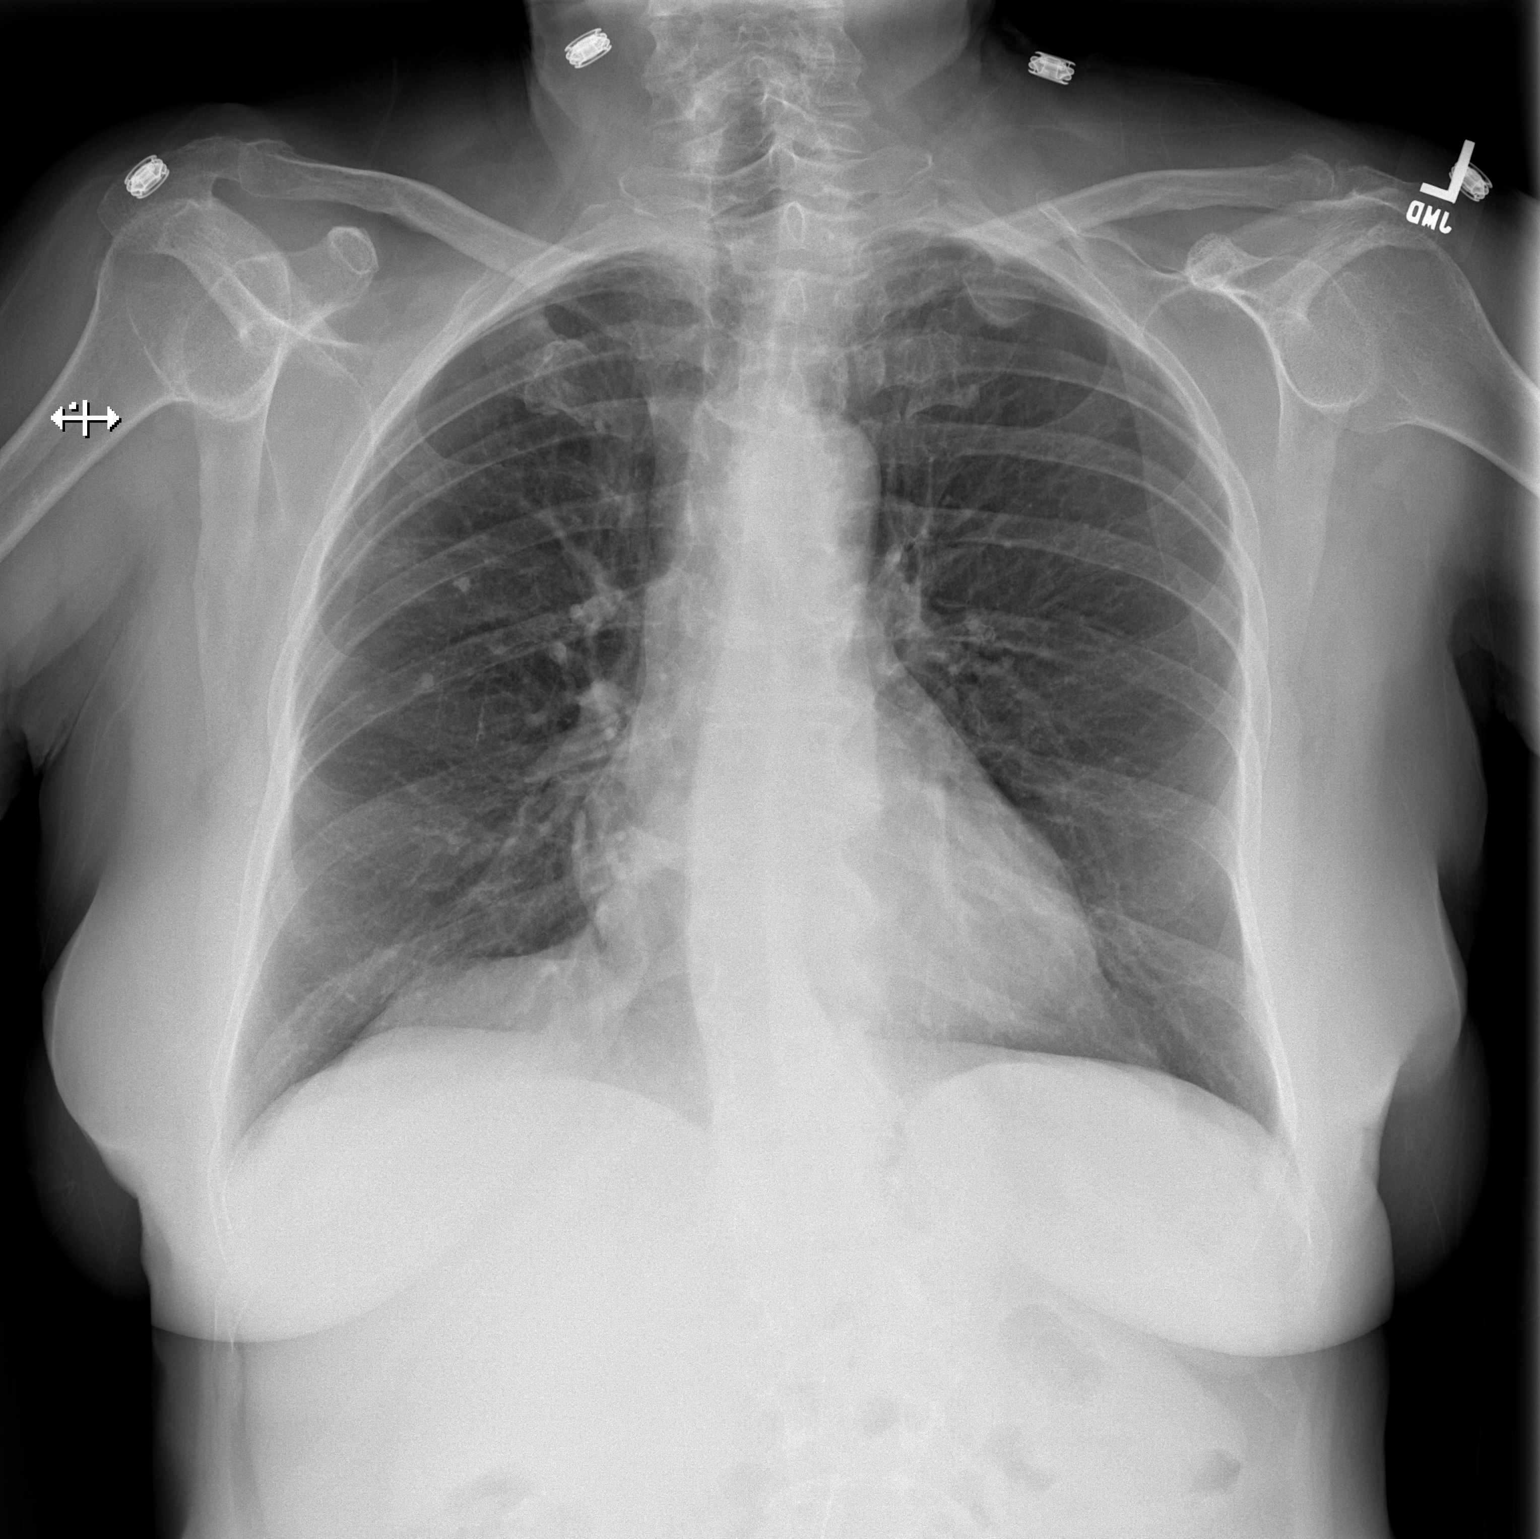

[w chest lat]
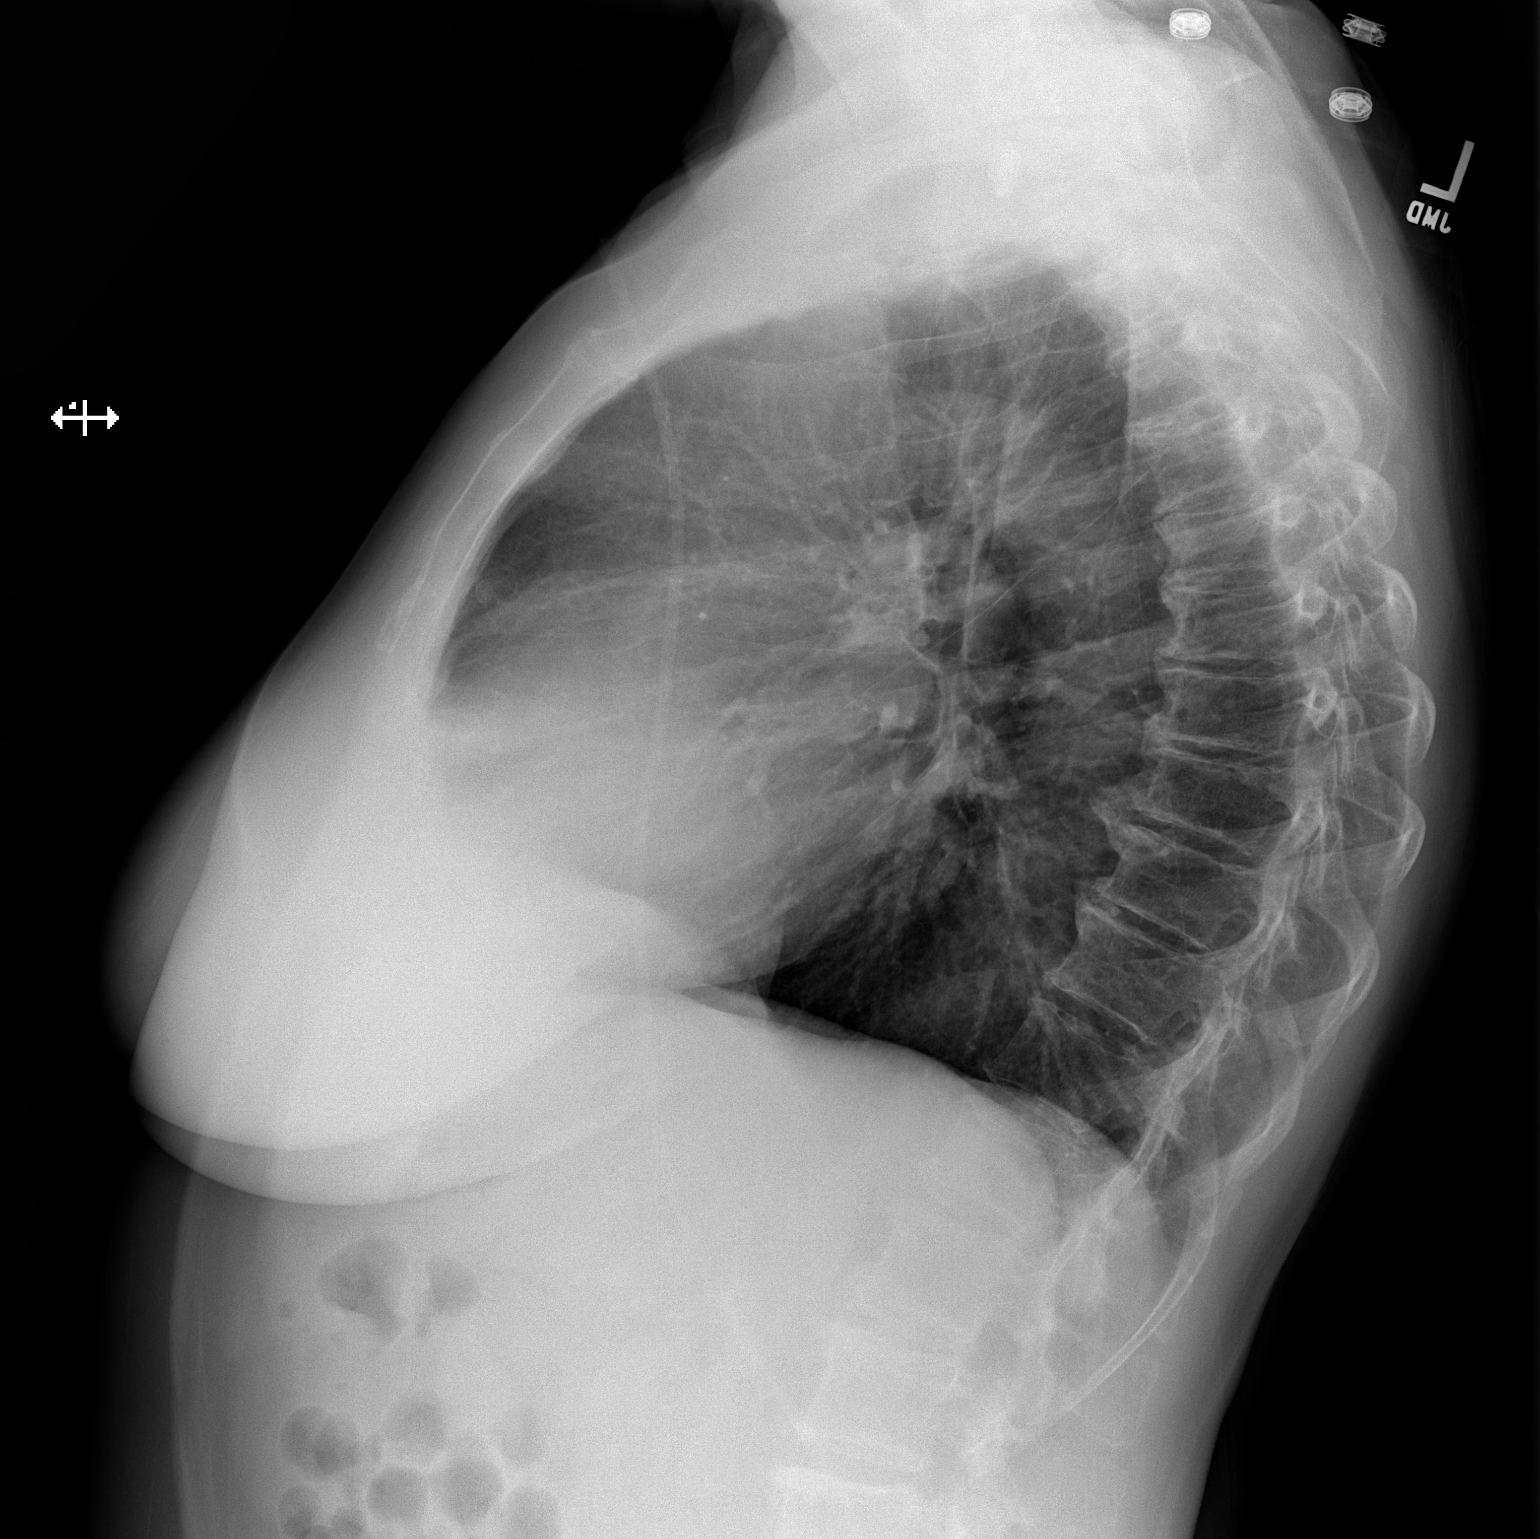

[2 of 2 positions shown; findings below may reference images not displayed]

FINDINGS: Mediastinum hilar structures normal. Heart size normal. No pulmonary
venous congestion. Calcified pulmonary nodules right mid lung. These
are consistent granulomas. No acute infiltrate. No pleural effusion
or pneumothorax. Degenerative change thoracic spine.
IMPRESSION: No acute cardiopulmonary disease.

## 2019-07-20 ENCOUNTER — Other Ambulatory Visit: Payer: Self-pay | Admitting: Internal Medicine

## 2019-07-20 DIAGNOSIS — K5100 Ulcerative (chronic) pancolitis without complications: Secondary | ICD-10-CM

## 2019-07-20 DIAGNOSIS — D123 Benign neoplasm of transverse colon: Secondary | ICD-10-CM

## 2019-07-20 DIAGNOSIS — R195 Other fecal abnormalities: Secondary | ICD-10-CM

## 2019-08-12 DIAGNOSIS — I10 Essential (primary) hypertension: Secondary | ICD-10-CM | POA: Diagnosis not present

## 2019-08-12 DIAGNOSIS — Z0000 Encounter for general adult medical examination without abnormal findings: Secondary | ICD-10-CM | POA: Diagnosis not present

## 2019-08-12 DIAGNOSIS — M810 Age-related osteoporosis without current pathological fracture: Secondary | ICD-10-CM | POA: Diagnosis not present

## 2019-08-12 DIAGNOSIS — E7849 Other hyperlipidemia: Secondary | ICD-10-CM | POA: Diagnosis not present

## 2019-08-12 DIAGNOSIS — R7301 Impaired fasting glucose: Secondary | ICD-10-CM | POA: Diagnosis not present

## 2019-08-19 DIAGNOSIS — R7301 Impaired fasting glucose: Secondary | ICD-10-CM | POA: Diagnosis not present

## 2019-08-19 DIAGNOSIS — M810 Age-related osteoporosis without current pathological fracture: Secondary | ICD-10-CM | POA: Diagnosis not present

## 2019-08-19 DIAGNOSIS — Z0000 Encounter for general adult medical examination without abnormal findings: Secondary | ICD-10-CM | POA: Diagnosis not present

## 2019-08-19 DIAGNOSIS — I10 Essential (primary) hypertension: Secondary | ICD-10-CM | POA: Diagnosis not present

## 2019-08-19 DIAGNOSIS — F418 Other specified anxiety disorders: Secondary | ICD-10-CM | POA: Diagnosis not present

## 2019-08-19 DIAGNOSIS — K219 Gastro-esophageal reflux disease without esophagitis: Secondary | ICD-10-CM | POA: Diagnosis not present

## 2019-08-19 DIAGNOSIS — K5790 Diverticulosis of intestine, part unspecified, without perforation or abscess without bleeding: Secondary | ICD-10-CM | POA: Diagnosis not present

## 2019-08-19 DIAGNOSIS — K5190 Ulcerative colitis, unspecified, without complications: Secondary | ICD-10-CM | POA: Diagnosis not present

## 2019-08-19 DIAGNOSIS — R82998 Other abnormal findings in urine: Secondary | ICD-10-CM | POA: Diagnosis not present

## 2019-08-19 DIAGNOSIS — E785 Hyperlipidemia, unspecified: Secondary | ICD-10-CM | POA: Diagnosis not present

## 2019-09-10 DIAGNOSIS — Z1212 Encounter for screening for malignant neoplasm of rectum: Secondary | ICD-10-CM | POA: Diagnosis not present

## 2019-09-14 DIAGNOSIS — R809 Proteinuria, unspecified: Secondary | ICD-10-CM | POA: Diagnosis not present

## 2019-09-14 DIAGNOSIS — N390 Urinary tract infection, site not specified: Secondary | ICD-10-CM | POA: Diagnosis not present

## 2019-10-08 ENCOUNTER — Other Ambulatory Visit: Payer: Self-pay | Admitting: Internal Medicine

## 2019-10-08 DIAGNOSIS — R195 Other fecal abnormalities: Secondary | ICD-10-CM

## 2019-10-08 DIAGNOSIS — D123 Benign neoplasm of transverse colon: Secondary | ICD-10-CM

## 2019-10-08 DIAGNOSIS — K5100 Ulcerative (chronic) pancolitis without complications: Secondary | ICD-10-CM

## 2019-10-19 DIAGNOSIS — N390 Urinary tract infection, site not specified: Secondary | ICD-10-CM | POA: Diagnosis not present

## 2019-12-09 DIAGNOSIS — N390 Urinary tract infection, site not specified: Secondary | ICD-10-CM | POA: Diagnosis not present

## 2020-01-05 ENCOUNTER — Other Ambulatory Visit: Payer: Self-pay | Admitting: Internal Medicine

## 2020-01-05 DIAGNOSIS — R195 Other fecal abnormalities: Secondary | ICD-10-CM

## 2020-01-05 DIAGNOSIS — D123 Benign neoplasm of transverse colon: Secondary | ICD-10-CM

## 2020-01-05 DIAGNOSIS — K5100 Ulcerative (chronic) pancolitis without complications: Secondary | ICD-10-CM

## 2020-01-12 ENCOUNTER — Other Ambulatory Visit: Payer: Self-pay | Admitting: Internal Medicine

## 2020-01-12 DIAGNOSIS — R195 Other fecal abnormalities: Secondary | ICD-10-CM

## 2020-01-12 DIAGNOSIS — K5100 Ulcerative (chronic) pancolitis without complications: Secondary | ICD-10-CM

## 2020-01-12 DIAGNOSIS — D123 Benign neoplasm of transverse colon: Secondary | ICD-10-CM

## 2020-02-08 ENCOUNTER — Other Ambulatory Visit: Payer: Self-pay | Admitting: Internal Medicine

## 2020-02-08 DIAGNOSIS — D123 Benign neoplasm of transverse colon: Secondary | ICD-10-CM

## 2020-02-08 DIAGNOSIS — K5100 Ulcerative (chronic) pancolitis without complications: Secondary | ICD-10-CM

## 2020-02-08 DIAGNOSIS — R195 Other fecal abnormalities: Secondary | ICD-10-CM

## 2020-02-23 DIAGNOSIS — K219 Gastro-esophageal reflux disease without esophagitis: Secondary | ICD-10-CM | POA: Diagnosis not present

## 2020-02-23 DIAGNOSIS — N390 Urinary tract infection, site not specified: Secondary | ICD-10-CM | POA: Diagnosis not present

## 2020-02-23 DIAGNOSIS — K5790 Diverticulosis of intestine, part unspecified, without perforation or abscess without bleeding: Secondary | ICD-10-CM | POA: Diagnosis not present

## 2020-02-23 DIAGNOSIS — I10 Essential (primary) hypertension: Secondary | ICD-10-CM | POA: Diagnosis not present

## 2020-02-23 DIAGNOSIS — M810 Age-related osteoporosis without current pathological fracture: Secondary | ICD-10-CM | POA: Diagnosis not present

## 2020-02-23 DIAGNOSIS — K5190 Ulcerative colitis, unspecified, without complications: Secondary | ICD-10-CM | POA: Diagnosis not present

## 2020-02-23 DIAGNOSIS — F411 Generalized anxiety disorder: Secondary | ICD-10-CM | POA: Diagnosis not present

## 2020-02-23 DIAGNOSIS — E785 Hyperlipidemia, unspecified: Secondary | ICD-10-CM | POA: Diagnosis not present

## 2020-02-29 ENCOUNTER — Other Ambulatory Visit: Payer: Self-pay | Admitting: Internal Medicine

## 2020-02-29 DIAGNOSIS — Z1231 Encounter for screening mammogram for malignant neoplasm of breast: Secondary | ICD-10-CM

## 2020-03-02 ENCOUNTER — Encounter: Payer: Self-pay | Admitting: Internal Medicine

## 2020-03-02 ENCOUNTER — Ambulatory Visit: Payer: Medicare HMO | Admitting: Internal Medicine

## 2020-03-02 VITALS — BP 124/66 | HR 70 | Ht 65.0 in | Wt 152.0 lb

## 2020-03-02 DIAGNOSIS — K5190 Ulcerative colitis, unspecified, without complications: Secondary | ICD-10-CM

## 2020-03-02 DIAGNOSIS — D123 Benign neoplasm of transverse colon: Secondary | ICD-10-CM

## 2020-03-02 DIAGNOSIS — R195 Other fecal abnormalities: Secondary | ICD-10-CM

## 2020-03-02 DIAGNOSIS — K5100 Ulcerative (chronic) pancolitis without complications: Secondary | ICD-10-CM

## 2020-03-02 DIAGNOSIS — K219 Gastro-esophageal reflux disease without esophagitis: Secondary | ICD-10-CM

## 2020-03-02 MED ORDER — MESALAMINE 1.2 G PO TBEC: DELAYED_RELEASE_TABLET | ORAL | 3 refills | Status: DC

## 2020-03-02 NOTE — Progress Notes (Signed)
HISTORY OF PRESENT ILLNESS:  Rhonda Woods is a 75 y.o. female with GERD, IBS, anxiety, and idiopathic proctosigmoiditis diagnosed on colonoscopy May 20, 2016.  She was last evaluated via telehealth medicine June 2020 at that time she had been continuing on maintenance therapy in the form of Lialda 2.4 g daily.  She was doing well.  She also takes omeprazole for GERD.  She presents today for follow-up and requests medication refill.  She is pleased to report that she is doing well on the alto 2.4 g daily.  Regular bowel habits without bleeding.  No abdominal pain.  She continues on omeprazole for GERD.  No active symptoms.  She has been struggling with depression but is working through this with her primary care provider.  No active GI complaints  REVIEW OF SYSTEMS:  All non-GI ROS negative unless otherwise stated in the HPI except for anxiety, depression, night sweats  Past Medical History:  Diagnosis Date  . Adrenal mass, right ([place])   . Anxiety   . Depression   . Diverticulosis   . GERD (gastroesophageal reflux disease)   . Hyperlipidemia   . IBS (irritable bowel syndrome)   . Osteoporosis   . UTI (urinary tract infection)     Past Surgical History:  Procedure Laterality Date  . BREAST SURGERY Left    cyst removal  . cataracts    . COLONOSCOPY      Social History Rhonda Woods  reports that she quit smoking about 12 years ago. Her smoking use included cigarettes. She has never used smokeless tobacco. She reports current alcohol use. No history on file for drug use.  family history includes Bladder Cancer in her brother; CVA in her mother; Dementia in her brother; Diabetes in her father; Hypertension in her mother; Parkinson's disease in her father.  Allergies  Allergen Reactions  . Demerol [Meperidine]   . Hydrocodone-Acetaminophen     REACTION: spinning  . Meperidine Hcl     REACTION: hives  . Vicodin [Hydrocodone-Acetaminophen]        PHYSICAL EXAMINATION: Vital  signs: BP 124/66   Pulse 70   Ht 5\' 5"  (1.651 m)   Wt 152 lb (68.9 kg)   SpO2 98%   BMI 25.29 kg/m   Constitutional: generally well-appearing, no acute distress Psychiatric: alert and oriented x3, cooperative Eyes: extraocular movements intact, anicteric, conjunctiva pink Mouth: oral pharynx moist, no lesions Neck: supple no lymphadenopathy Cardiovascular: heart regular rate and rhythm, no murmur Lungs: clear to auscultation bilaterally Abdomen: soft, nontender, nondistended, no obvious ascites, no peritoneal signs, normal bowel sounds, no organomegaly Rectal: Omitted Extremities: no clubbing, cyanosis, or lower extremity edema bilaterally Skin: no lesions on visible extremities Neuro: No focal deficits.  Cranial nerves intact  ASSESSMENT:  1.  Idiopathic ulcerative proctosigmoiditis.  Remains asymptomatic on oral mesalamine in the form of Lialda 2.4 g daily 2.  GERD.  Asymptomatic on PPI 3.  History of adenomatous colon polyps on most recent colonoscopy (April 2018)   PLAN:  1.  Refill Lialda 2.4 g daily. 2.  Reflux precautions 3.  Continue omeprazole 20 mg daily 4.  Routine office follow-up 1 year.  Contact the office in the interim for any questions or problems 5.  Surveillance colonoscopy around April 2023

## 2020-03-02 NOTE — Patient Instructions (Signed)
We have sent the following medications to your pharmacy for you to pick up at your convenience:  Lialda  Please follow up in one year

## 2020-03-13 ENCOUNTER — Telehealth: Payer: Self-pay | Admitting: Internal Medicine

## 2020-03-15 ENCOUNTER — Telehealth: Payer: Self-pay | Admitting: Internal Medicine

## 2020-03-15 DIAGNOSIS — D123 Benign neoplasm of transverse colon: Secondary | ICD-10-CM

## 2020-03-15 DIAGNOSIS — K5100 Ulcerative (chronic) pancolitis without complications: Secondary | ICD-10-CM

## 2020-03-15 DIAGNOSIS — R195 Other fecal abnormalities: Secondary | ICD-10-CM

## 2020-03-15 NOTE — Telephone Encounter (Signed)
Pt called to follow up on this request. She stated that she is running out.

## 2020-03-15 NOTE — Telephone Encounter (Signed)
Spoke with patient - pharmacy told her insurance alternative was Balsalazide.  Please advise

## 2020-03-15 NOTE — Telephone Encounter (Signed)
Okay to change: Balsalazide comes in capsules 0.75 g each.  If capsules, take 5 (total of 3.75 g) once daily.  1 month would be 150 capsules. Balsalazide comes in tablets 1.1 g each.  If tablets, take 4 (4.4 g) once daily.  1 month would be 120 tablets.

## 2020-03-16 MED ORDER — MESALAMINE 1.2 G PO TBEC: DELAYED_RELEASE_TABLET | ORAL | 3 refills | Status: DC

## 2020-03-16 NOTE — Telephone Encounter (Signed)
Was able to get a PA for Lialda approved through [employer].

## 2020-03-16 NOTE — Telephone Encounter (Signed)
Lialda PA approved through [facility].  Resent rx with this information.  Sent patient a mychart message to let her know

## 2020-03-26 IMAGING — MG DIGITAL SCREENING BILATERAL MAMMOGRAM WITH TOMO AND CAD
8 series · 8 of 24 positions shown · non-contrast
Comparison: Previous exam(s).

CLINICAL DATA: Screening.

EXAM:
DIGITAL SCREENING BILATERAL MAMMOGRAM WITH TOMO AND CAD

[L MLO synth-2D]
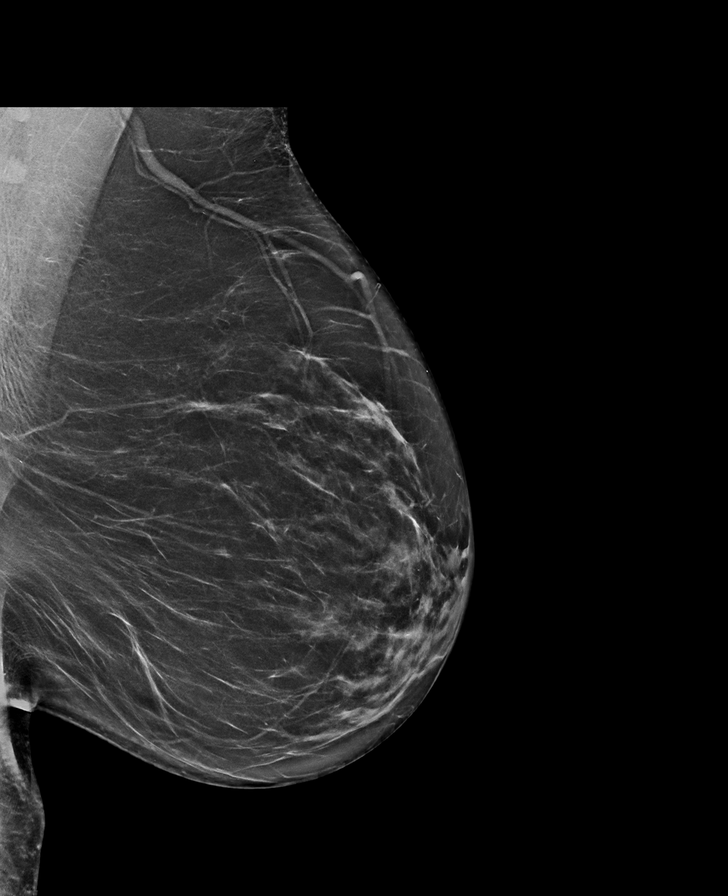

[R CC synth-2D]
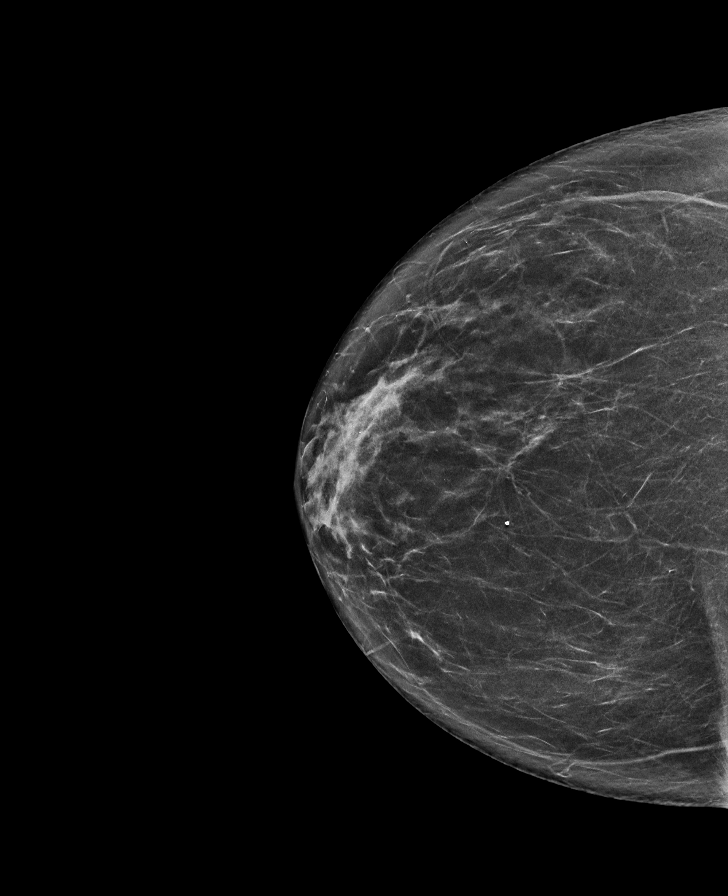

[L CC synth-2D]
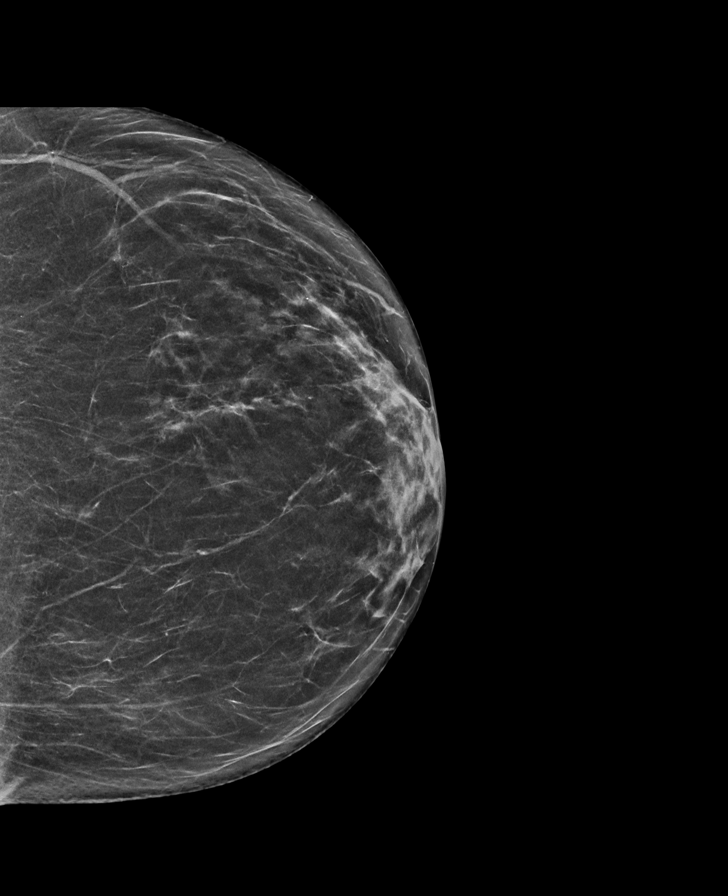

[R MLO synth-2D]
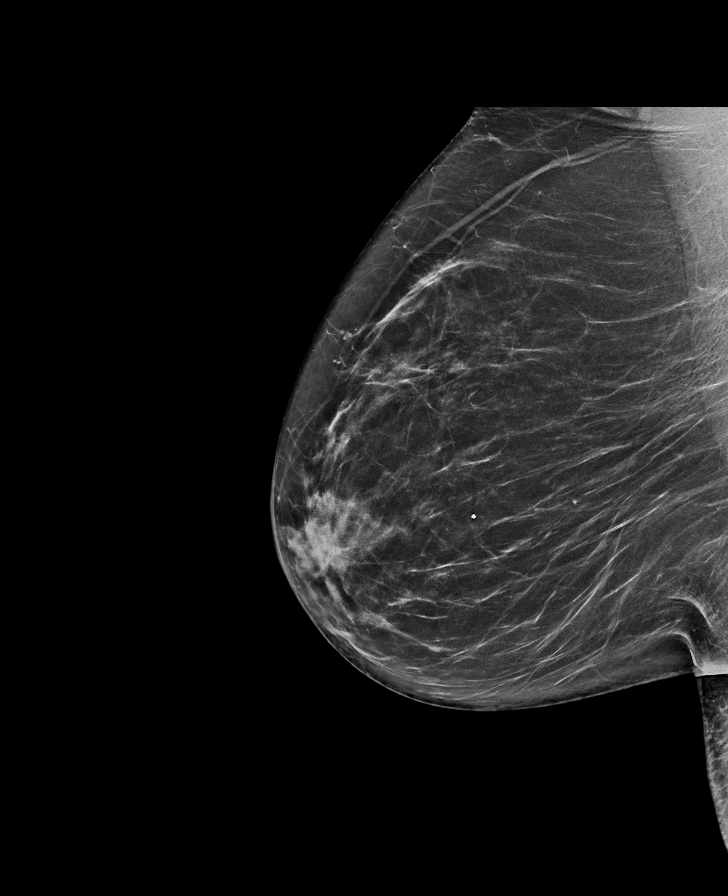

[R CC tomo · tomo slice 33/66.0]
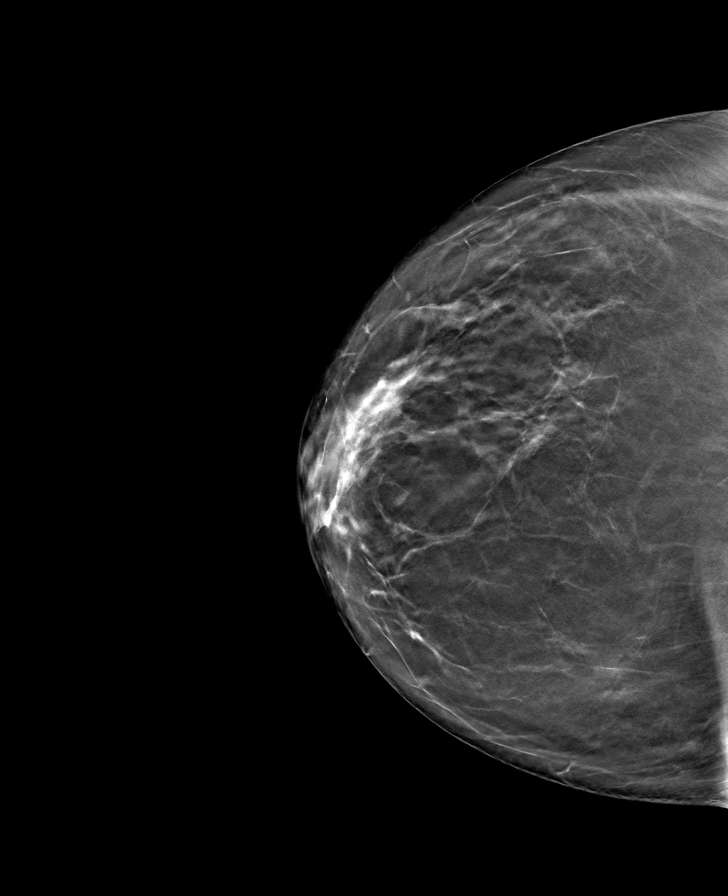

[R MLO tomo · tomo slice 37/72.0]
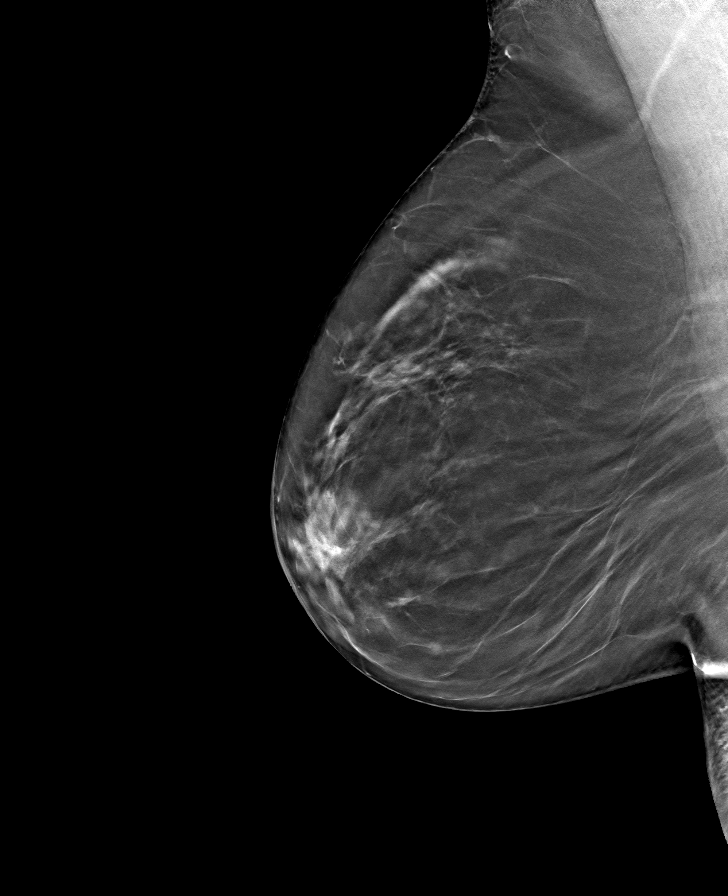

[L CC tomo · tomo slice 32/63.0]
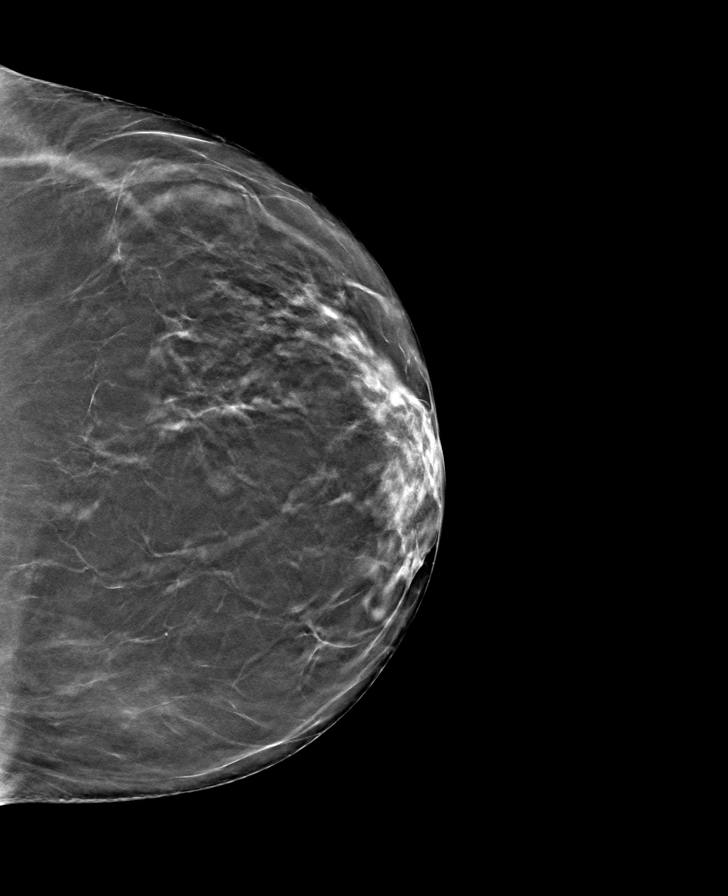

[L MLO tomo · tomo slice 39/78.0]
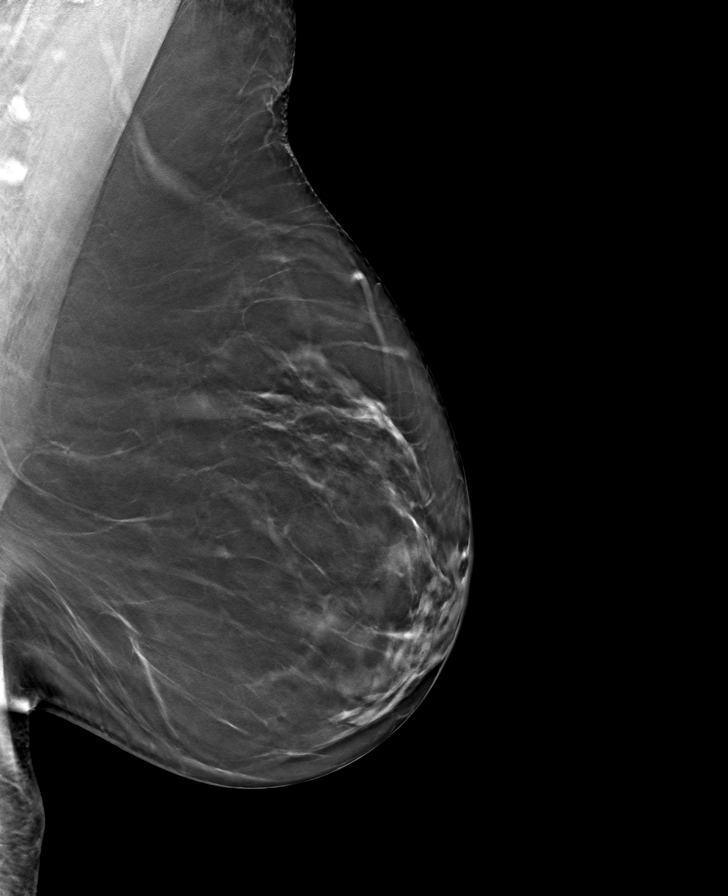

[8 of 24 positions shown; findings below may reference images not displayed]

ACR Breast Density Category b: There are scattered areas of
fibroglandular density.
FINDINGS: There are no findings suspicious for malignancy. Images were
processed with CAD.
IMPRESSION: No mammographic evidence of malignancy. A result letter of this
screening mammogram will be mailed directly to the patient.

RECOMMENDATION:
Screening mammogram in one year. (Code:[id number])

BI-RADS CATEGORY  1: Negative.

## 2020-04-19 ENCOUNTER — Ambulatory Visit: Payer: Medicare HMO

## 2020-04-21 ENCOUNTER — Ambulatory Visit
Admission: RE | Admit: 2020-04-21 | Discharge: 2020-04-21 | Disposition: A | Discharge status: Home / Self Care | Payer: Medicare HMO | Source: Ambulatory Visit | Attending: Internal Medicine | Admitting: Internal Medicine

## 2020-04-21 ENCOUNTER — Other Ambulatory Visit: Payer: Self-pay

## 2020-04-21 DIAGNOSIS — Z1231 Encounter for screening mammogram for malignant neoplasm of breast: Secondary | ICD-10-CM

## 2020-05-10 ENCOUNTER — Ambulatory Visit: Payer: Medicare HMO

## 2020-06-13 ENCOUNTER — Ambulatory Visit: Payer: Medicare HMO

## 2020-09-27 DIAGNOSIS — M810 Age-related osteoporosis without current pathological fracture: Secondary | ICD-10-CM | POA: Diagnosis not present

## 2020-09-27 DIAGNOSIS — E785 Hyperlipidemia, unspecified: Secondary | ICD-10-CM | POA: Diagnosis not present

## 2020-09-27 DIAGNOSIS — R7301 Impaired fasting glucose: Secondary | ICD-10-CM | POA: Diagnosis not present

## 2020-10-04 DIAGNOSIS — K5190 Ulcerative colitis, unspecified, without complications: Secondary | ICD-10-CM | POA: Diagnosis not present

## 2020-10-04 DIAGNOSIS — E785 Hyperlipidemia, unspecified: Secondary | ICD-10-CM | POA: Diagnosis not present

## 2020-10-04 DIAGNOSIS — M810 Age-related osteoporosis without current pathological fracture: Secondary | ICD-10-CM | POA: Diagnosis not present

## 2020-10-04 DIAGNOSIS — N390 Urinary tract infection, site not specified: Secondary | ICD-10-CM | POA: Diagnosis not present

## 2020-10-04 DIAGNOSIS — Z23 Encounter for immunization: Secondary | ICD-10-CM | POA: Diagnosis not present

## 2020-10-04 DIAGNOSIS — Z1212 Encounter for screening for malignant neoplasm of rectum: Secondary | ICD-10-CM | POA: Diagnosis not present

## 2020-10-04 DIAGNOSIS — K5790 Diverticulosis of intestine, part unspecified, without perforation or abscess without bleeding: Secondary | ICD-10-CM | POA: Diagnosis not present

## 2020-10-04 DIAGNOSIS — I10 Essential (primary) hypertension: Secondary | ICD-10-CM | POA: Diagnosis not present

## 2020-10-04 DIAGNOSIS — Z0000 Encounter for general adult medical examination without abnormal findings: Secondary | ICD-10-CM | POA: Diagnosis not present

## 2020-10-04 DIAGNOSIS — R82998 Other abnormal findings in urine: Secondary | ICD-10-CM | POA: Diagnosis not present

## 2020-10-04 DIAGNOSIS — F411 Generalized anxiety disorder: Secondary | ICD-10-CM | POA: Diagnosis not present

## 2021-02-07 ENCOUNTER — Encounter: Payer: Self-pay | Admitting: Internal Medicine

## 2021-02-15 DIAGNOSIS — K5190 Ulcerative colitis, unspecified, without complications: Secondary | ICD-10-CM | POA: Diagnosis not present

## 2021-02-15 DIAGNOSIS — I10 Essential (primary) hypertension: Secondary | ICD-10-CM | POA: Diagnosis not present

## 2021-02-15 DIAGNOSIS — E785 Hyperlipidemia, unspecified: Secondary | ICD-10-CM | POA: Diagnosis not present

## 2021-02-15 DIAGNOSIS — R7301 Impaired fasting glucose: Secondary | ICD-10-CM | POA: Diagnosis not present

## 2021-02-15 DIAGNOSIS — M810 Age-related osteoporosis without current pathological fracture: Secondary | ICD-10-CM | POA: Diagnosis not present

## 2021-02-15 DIAGNOSIS — F411 Generalized anxiety disorder: Secondary | ICD-10-CM | POA: Diagnosis not present

## 2021-02-15 DIAGNOSIS — Z23 Encounter for immunization: Secondary | ICD-10-CM | POA: Diagnosis not present

## 2021-03-18 ENCOUNTER — Other Ambulatory Visit: Payer: Self-pay | Admitting: Internal Medicine

## 2021-03-18 DIAGNOSIS — K5100 Ulcerative (chronic) pancolitis without complications: Secondary | ICD-10-CM

## 2021-03-18 DIAGNOSIS — R195 Other fecal abnormalities: Secondary | ICD-10-CM

## 2021-03-18 DIAGNOSIS — D123 Benign neoplasm of transverse colon: Secondary | ICD-10-CM

## 2021-03-22 ENCOUNTER — Ambulatory Visit: Payer: Medicare HMO | Admitting: Internal Medicine

## 2021-03-22 ENCOUNTER — Encounter: Payer: Self-pay | Admitting: Internal Medicine

## 2021-03-22 VITALS — BP 120/60 | HR 53 | Ht 65.0 in | Wt 146.0 lb

## 2021-03-22 DIAGNOSIS — K5190 Ulcerative colitis, unspecified, without complications: Secondary | ICD-10-CM | POA: Diagnosis not present

## 2021-03-22 DIAGNOSIS — K5100 Ulcerative (chronic) pancolitis without complications: Secondary | ICD-10-CM

## 2021-03-22 DIAGNOSIS — D123 Benign neoplasm of transverse colon: Secondary | ICD-10-CM

## 2021-03-22 DIAGNOSIS — R195 Other fecal abnormalities: Secondary | ICD-10-CM

## 2021-03-22 MED ORDER — MESALAMINE 1.2 G PO TBEC: DELAYED_RELEASE_TABLET | ORAL | 3 refills | Status: DC

## 2021-03-22 NOTE — Progress Notes (Signed)
HISTORY OF PRESENT ILLNESS: ? ?Rhonda Woods is a 76 y.o. female with GERD, IBS, anxiety, and idiopathic proctosigmoiditis diagnosed on colonoscopy April 2018.  She was last seen March 02, 2020.  See that dictation.  At that time she was asymptomatic on oral mesalamine in the form of Lialda 2.4 g daily.  Patient tells me that she has continued on Lialda 2.4 g daily.  She reports that she is doing well.  No diarrhea or rectal bleeding.  No abdominal pain.  She reports 2-4 bowel movements per day.  She continues on omeprazole 20 mg daily for GERD.  This works.  No dysphagia.  No new GI complaints.  Her last colonoscopy was performed April 2018.  She was found to have adenomatous colon polyp.  Follow-up in 5 years recommended for neoplasia surveillance and IBD surveillance. ? ?REVIEW OF SYSTEMS: ? ?All non-GI ROS negative unless otherwise stated in the HPI. ? ?Past Medical History:  ?Diagnosis Date  ? Adrenal mass, right ([place])   ? Anxiety   ? Depression   ? Diverticulosis   ? GERD (gastroesophageal reflux disease)   ? Hyperlipidemia   ? IBS (irritable bowel syndrome)   ? Osteoporosis   ? UTI (urinary tract infection)   ? ? ?Past Surgical History:  ?Procedure Laterality Date  ? BREAST SURGERY Left   ? cyst removal  ? cataracts    ? COLONOSCOPY    ? ? ?Social History ?Rhonda Woods  reports that she quit smoking about 13 years ago. Her smoking use included cigarettes. She has never used smokeless tobacco. She reports current alcohol use. No history on file for drug use. ? ?family history includes Bladder Cancer in her brother; CVA in her mother; Dementia in her brother; Diabetes in her father; Hypertension in her mother; Parkinson's disease in her father. ? ?Allergies  ?Allergen Reactions  ? Demerol [Meperidine]   ? Hydrocodone-Acetaminophen   ?  REACTION: spinning  ? Meperidine Hcl   ?  REACTION: hives  ? Vicodin [Hydrocodone-Acetaminophen]   ? ? ?  ? ?PHYSICAL EXAMINATION: ?Vital signs: BP 120/60   Pulse (!) 53    Ht 5' 5"  (1.651 m)   Wt 146 lb (66.2 kg)   SpO2 97%   BMI 24.30 kg/m?   ?Constitutional: generally well-appearing, no acute distress ?Psychiatric: alert and oriented x3, cooperative ?Eyes: extraocular movements intact, anicteric, conjunctiva pink ?Mouth: oral pharynx moist, no lesions ?Neck: supple no lymphadenopathy ?Cardiovascular: heart regular rate and rhythm, no murmur ?Lungs: clear to auscultation bilaterally ?Abdomen: soft, nontender, nondistended, no obvious ascites, no peritoneal signs, normal bowel sounds, no organomegaly ?Rectal: Omitted ?Extremities: no lower extremity edema bilaterally ?Skin: no lesions on visible extremities ?Neuro: No focal deficits.  Cranial nerves intact ? ?ASSESSMENT: ? ?1.  Idiopathic proctosigmoiditis.  In clinical remission on Lialda 2.4 g daily. ?2.  History of adenomatous colon polyp.  Last colonoscopy April 2018 ?3.  GERD.  Controlled with omeprazole 20 mg daily ?4.  General medical problems.  Stable ? ? ?PLAN: ? ?1.  Continue Lialda 2.4 g daily.  Prescribed with multiple refills.  Medication risks reviewed. ?2.  Reflux precautions ?3.  Continue omeprazole ?4.  Surveillance colonoscopy due April 2023.  I offered for her to have this scheduled today.  She declined.  She is not sure that she wants a follow-up colonoscopy.  We discussed the role of surveillance colonoscopy in her particular clinical circumstance. ?5.  Routine office follow-up 1 year ? ? ? ? ?  ?

## 2021-03-22 NOTE — Patient Instructions (Signed)
If you are age 77 or older, your body mass index should be between 23-30. Your Body mass index is 24.3 kg/m?Marland Kitchen If this is out of the aforementioned range listed, please consider follow up with your Primary Care Provider. ? ?If you are age 9 or younger, your body mass index should be between 19-25. Your Body mass index is 24.3 kg/m?Marland Kitchen If this is out of the aformentioned range listed, please consider follow up with your Primary Care Provider.  ? ?________________________________________________________ ? ?The [facility] providers would like to encourage you to use [facility] to communicate with providers for non-urgent requests or questions.  Due to long hold times on the telephone, sending your provider a message by [facility] may be a faster and more efficient way to get a response.  Please allow 48 business hours for a response.  Please remember that this is for non-urgent requests.  ?_______________________________________________________ ? ?We have sent the following medications to your pharmacy for you to pick up at your convenience:  Lialda ? ?Please follow up in one year ? ?

## 2021-03-28 ENCOUNTER — Encounter: Payer: Self-pay | Admitting: Internal Medicine

## 2021-03-28 ENCOUNTER — Telehealth: Payer: Self-pay | Admitting: Internal Medicine

## 2021-03-28 ENCOUNTER — Telehealth: Payer: Self-pay

## 2021-03-28 DIAGNOSIS — R197 Diarrhea, unspecified: Secondary | ICD-10-CM

## 2021-03-28 NOTE — Telephone Encounter (Signed)
Pt called and states she has been having diarrhea since Sunday, reports she has had to many diarrhea stools to count. She reports her bottom is raw. She was just seen on 03/23/21 and has been taking her mesalamine as prescribed. Pt wants to know what Dr. Henrene Pastor recommends. Please advise. ?

## 2021-03-28 NOTE — Telephone Encounter (Signed)
Left message for pt to call back. ? ?See patient advice request. ?

## 2021-03-28 NOTE — Telephone Encounter (Signed)
Pt states no bleeding or fever but some cramping. She knows to come for stool specimen, order in epic. She is aware of Dr. Blanch Media recommendations.  ?

## 2021-03-28 NOTE — Telephone Encounter (Signed)
Inbound call from patient states she have had diarrhea for a few days and need advice ?

## 2021-03-28 NOTE — Telephone Encounter (Signed)
1.  Any bleeding or abdominal cramping or fevers. ?2.  Have her submit stool for enteric pathogens including C. difficile ?3.  Use Imodium ?4.  Hydrate well ?5.  Depending upon all of the above, additional recommendations may follow. ?

## 2021-03-29 ENCOUNTER — Other Ambulatory Visit: Payer: Medicare HMO

## 2021-03-30 ENCOUNTER — Other Ambulatory Visit: Payer: Medicare HMO

## 2021-03-30 DIAGNOSIS — R195 Other fecal abnormalities: Secondary | ICD-10-CM | POA: Diagnosis not present

## 2021-03-30 DIAGNOSIS — R197 Diarrhea, unspecified: Secondary | ICD-10-CM

## 2021-04-01 ENCOUNTER — Telehealth: Payer: Self-pay | Admitting: Nurse Practitioner

## 2021-04-01 NOTE — Telephone Encounter (Signed)
Patient called answering service for problems with diarrhea.  She has idiopathic proctosigmoiditis and was in clinical remission on Lialda at time of visit on 03/22/21, see below..  ? ?ASSESSMENT: ?  ?1.  Idiopathic proctosigmoiditis.  In clinical remission on Lialda 2.4 g daily. ?2.  History of adenomatous colon polyp.  Last colonoscopy April 2018 ?3.  GERD.  Controlled with omeprazole 20 mg daily ?4.  General medical problems.  Stable ?  ?  ?PLAN: ?  ?1.  Continue Lialda 2.4 g daily.  Prescribed with multiple refills.  Medication risks reviewed. ?2.  Reflux precautions ?3.  Continue omeprazole ?4.  Surveillance colonoscopy due April 2023.  I offered for her to have this scheduled today.  She declined.  She is not sure that she wants a follow-up colonoscopy.  We discussed the role of surveillance colonoscopy in her particular clinical circumstance. ?5.  Routine office follow-up 1 year ?  ? ?On 3/8 /23 patient called office with several days of diarrhea. Advised to submit stool for C-diff, use imodium, hydrate. Stool studies are pending. Yesterday didn't have BM, but today she  "is dying" . Hasn't started the imodium. No fevers, no blood in stool and no abdominal pain  ? ? ?Recc:  ?Ok to take imodium 2 mg one now and one this evening. Wouldn't take anymore than that since not having diarrhea every day. Bland diet, stay hydrated.. Our office will contact her with stool study results ?

## 2021-04-02 NOTE — Telephone Encounter (Signed)
I think this was meant for you, since you had been talking to this patient. ?

## 2021-04-02 NOTE — Telephone Encounter (Addendum)
Returned call to patient and her husband. Pt's husband reports that patient had "a lot" of bleeding this morning after her BM. Pt reports that she passed a medium volume of loose stools and noticed BRB with wiping. Second visit to the restroom when pt wiped it was pink. I told pt that based on her last colonoscopy report that she did have internal hemorrhoids. I told her that her hemorrhoids could be irritated from the frequent diarrhea. I told pt to try Preparation H suppositories OTC. She denies any rectal pain or discomfort. I told her to use wet wipes instead of toilet paper and to pat the area dry. Pt reports that she was doing fine last night after taking the Imodium but then this morning she had diarrhea again. I told her to go ahead and take an additional Imodium if she is having a lot of diarrhea/loose stool. She knows that we will be in contact regarding stool study results. Pt and her husband verbalized understanding. ?

## 2021-04-02 NOTE — Telephone Encounter (Signed)
Inbound call from patient's husband stating she is having blood in her stool now.  Please advise. ?

## 2021-04-03 ENCOUNTER — Encounter: Payer: Self-pay | Admitting: Internal Medicine

## 2021-04-03 ENCOUNTER — Telehealth: Payer: Self-pay

## 2021-04-03 LAB — GI PROFILE, STOOL, PCR
Adenovirus F 40/41: NOT DETECTED
Astrovirus: NOT DETECTED
C difficile toxin A/B: DETECTED — AB
Campylobacter: DETECTED — AB
Cryptosporidium: NOT DETECTED
Cyclospora cayetanensis: NOT DETECTED
Entamoeba histolytica: NOT DETECTED
Enteroaggregative E coli: NOT DETECTED
Enteropathogenic E coli: NOT DETECTED
Enterotoxigenic E coli: NOT DETECTED
Giardia lamblia: NOT DETECTED
Norovirus GI/GII: DETECTED — AB
Plesiomonas shigelloides: NOT DETECTED
Rotavirus A: NOT DETECTED
Salmonella: NOT DETECTED
Sapovirus: NOT DETECTED
Shiga-toxin-producing E coli: NOT DETECTED
Shigella/Enteroinvasive E coli: NOT DETECTED
Vibrio cholerae: NOT DETECTED
Vibrio: NOT DETECTED
Yersinia enterocolitica: NOT DETECTED

## 2021-04-03 MED ORDER — VANCOMYCIN HCL 125 MG PO CAPS: 125.0000 mg | ORAL_CAPSULE | Freq: Four times a day (QID) | ORAL | 0 refills | Status: DC

## 2021-04-03 MED ORDER — VANCOMYCIN HCL 125 MG PO CAPS: 125.0000 mg | ORAL_CAPSULE | Freq: Four times a day (QID) | ORAL | 0 refills | Status: AC

## 2021-04-03 NOTE — Telephone Encounter (Signed)
PA for Vancomycin submitted to [employer] Meds.  Awaiting response.  Sent patient mychart message to let her know ?

## 2021-04-03 NOTE — Telephone Encounter (Signed)
PA for Vancomycin approved.  Resent to pharmacy with this information.  Sent patient mychart message letting her know. ?

## 2021-04-04 ENCOUNTER — Telehealth: Payer: Self-pay

## 2021-04-04 ENCOUNTER — Encounter: Payer: Self-pay | Admitting: Internal Medicine

## 2021-04-04 DIAGNOSIS — D123 Benign neoplasm of transverse colon: Secondary | ICD-10-CM

## 2021-04-04 DIAGNOSIS — K5100 Ulcerative (chronic) pancolitis without complications: Secondary | ICD-10-CM

## 2021-04-04 DIAGNOSIS — R195 Other fecal abnormalities: Secondary | ICD-10-CM

## 2021-04-04 MED ORDER — MESALAMINE 1.2 G PO TBEC: DELAYED_RELEASE_TABLET | ORAL | 3 refills | Status: DC

## 2021-04-04 NOTE — Telephone Encounter (Signed)
Lialda approved.  Resent to pharmacy with this information.  Sent mychart message to patient to let her know. ?

## 2021-04-04 NOTE — Telephone Encounter (Signed)
Submitted Lialda to Cover my Meds,high priority. Sent patient mychart message to let her know I was awaiting response ? ?

## 2021-04-09 ENCOUNTER — Encounter: Payer: Self-pay | Admitting: Internal Medicine

## 2021-04-15 ENCOUNTER — Encounter: Payer: Self-pay | Admitting: Internal Medicine

## 2021-04-16 ENCOUNTER — Encounter: Payer: Self-pay | Admitting: Internal Medicine

## 2021-04-17 ENCOUNTER — Encounter: Payer: Self-pay | Admitting: Internal Medicine

## 2021-04-20 ENCOUNTER — Encounter: Payer: Self-pay | Admitting: Gastroenterology

## 2021-04-21 ENCOUNTER — Encounter: Payer: Self-pay | Admitting: Internal Medicine

## 2021-04-22 ENCOUNTER — Telehealth: Payer: Self-pay | Admitting: Physician Assistant

## 2021-04-22 DIAGNOSIS — R197 Diarrhea, unspecified: Secondary | ICD-10-CM

## 2021-04-22 DIAGNOSIS — A048 Other specified bacterial intestinal infections: Secondary | ICD-10-CM

## 2021-04-22 MED ORDER — HYDROCORTISONE (PERIANAL) 2.5 % EX CREA: 1.0000 "application " | TOPICAL_CREAM | Freq: Two times a day (BID) | CUTANEOUS | 1 refills | Status: AC

## 2021-04-22 MED ORDER — COLESTIPOL HCL 1 G PO TABS: 1.0000 g | ORAL_TABLET | Freq: Two times a day (BID) | ORAL | 0 refills | Status: DC

## 2021-04-22 NOTE — Telephone Encounter (Signed)
Patient stopped Vancomycin Tuesday.  ?Started trimethoprim this week.  ? ?She woke up this morning at 6 AM with diarrhea, 8 + time, she has not been able to leave the rest.  ?No blood in the stool.  ?No dizziness, SOB, CP.  ?No fever, chills.  ?No AB pain.  ? ?Can take Pepto but can cause black stool.  ?Will send in colestid to take up to 2 x a day.  ?Avoid imodium.  ?Will schedule for Cdiff repeat tomorrow.  ?Can do longer vancomycin taper versus dificin, will send telephone visit to Dr. Henrene Pastor as well as my nurse to call patient tomorrow ?Marland Kitchen  ?Call the office or go to the ER if bloody stools, persistent diarrhea, not peeing regularly, unable to take oral fluids, vomiting, high fever, severe weakness, abdominal pain . ? ? ?

## 2021-04-23 ENCOUNTER — Other Ambulatory Visit: Payer: Medicare HMO

## 2021-04-23 ENCOUNTER — Other Ambulatory Visit: Payer: Self-pay

## 2021-04-23 DIAGNOSIS — R197 Diarrhea, unspecified: Secondary | ICD-10-CM

## 2021-04-23 MED ORDER — VANCOMYCIN HCL 125 MG PO CAPS: ORAL_CAPSULE | ORAL | 0 refills | Status: AC

## 2021-04-23 NOTE — Telephone Encounter (Signed)
Left message for patient to call back  

## 2021-04-23 NOTE — Telephone Encounter (Signed)
Rhonda Woods (or Designer, television/film set), ?Please contact the patient and let her know that this correspondence was shared with me. ?Let her know that recurrence of C. difficile infection, (especially if 1 is on antibiotics) can be as high as 40%.  Thus, I recommend a more prolonged course of therapy to reduce the risk of relapse. ?Vancomycin 125 mg 4 times daily for 2 weeks, then 3 times daily for 2 weeks, then 2 times daily for 2 weeks, then once daily for 2 weeks, then stop. ?Stop Colestid ?No need to repeat stool study as she has had a recent infection ?

## 2021-04-23 NOTE — Telephone Encounter (Signed)
I addressed this earlier today.  Somebody should have handled the phone note.  Please see that note and handle this for me.  Thanks ?If she can do without the trimethoprim, then good.  She needs to continue mesalamine. ?

## 2021-04-24 LAB — CLOSTRIDIUM DIFFICILE TOXIN B, QUALITATIVE, REAL-TIME PCR: Toxigenic C. Difficile by PCR: NOT DETECTED

## 2021-04-24 NOTE — Telephone Encounter (Signed)
Patient seems to communicate better through mychart. Instructions sent to patient & she is aware.  ?

## 2021-04-25 ENCOUNTER — Encounter: Payer: Self-pay | Admitting: Internal Medicine

## 2021-04-27 ENCOUNTER — Encounter: Payer: Self-pay | Admitting: Internal Medicine

## 2021-05-19 ENCOUNTER — Other Ambulatory Visit: Payer: Self-pay | Admitting: Physician Assistant

## 2021-05-19 DIAGNOSIS — R197 Diarrhea, unspecified: Secondary | ICD-10-CM

## 2021-05-25 ENCOUNTER — Other Ambulatory Visit: Payer: Self-pay | Admitting: Internal Medicine

## 2021-05-25 DIAGNOSIS — Z1231 Encounter for screening mammogram for malignant neoplasm of breast: Secondary | ICD-10-CM

## 2021-06-08 ENCOUNTER — Encounter: Payer: Self-pay | Admitting: Internal Medicine

## 2021-06-12 ENCOUNTER — Ambulatory Visit
Admission: RE | Admit: 2021-06-12 | Discharge: 2021-06-12 | Disposition: A | Discharge status: Home / Self Care | Payer: Medicare HMO | Source: Ambulatory Visit | Attending: Internal Medicine | Admitting: Internal Medicine

## 2021-06-12 DIAGNOSIS — Z1231 Encounter for screening mammogram for malignant neoplasm of breast: Secondary | ICD-10-CM | POA: Diagnosis not present

## 2021-06-16 ENCOUNTER — Encounter: Payer: Self-pay | Admitting: Internal Medicine

## 2021-08-09 ENCOUNTER — Encounter: Payer: Self-pay | Admitting: Internal Medicine

## 2021-10-18 DIAGNOSIS — M810 Age-related osteoporosis without current pathological fracture: Secondary | ICD-10-CM | POA: Diagnosis not present

## 2021-10-18 DIAGNOSIS — E785 Hyperlipidemia, unspecified: Secondary | ICD-10-CM | POA: Diagnosis not present

## 2021-10-18 DIAGNOSIS — I10 Essential (primary) hypertension: Secondary | ICD-10-CM | POA: Diagnosis not present

## 2021-10-18 DIAGNOSIS — Z1212 Encounter for screening for malignant neoplasm of rectum: Secondary | ICD-10-CM | POA: Diagnosis not present

## 2021-10-18 DIAGNOSIS — R7989 Other specified abnormal findings of blood chemistry: Secondary | ICD-10-CM | POA: Diagnosis not present

## 2021-10-18 DIAGNOSIS — R7301 Impaired fasting glucose: Secondary | ICD-10-CM | POA: Diagnosis not present

## 2021-10-23 DIAGNOSIS — I10 Essential (primary) hypertension: Secondary | ICD-10-CM | POA: Diagnosis not present

## 2021-10-23 DIAGNOSIS — Z0000 Encounter for general adult medical examination without abnormal findings: Secondary | ICD-10-CM | POA: Diagnosis not present

## 2021-10-23 DIAGNOSIS — K219 Gastro-esophageal reflux disease without esophagitis: Secondary | ICD-10-CM | POA: Diagnosis not present

## 2021-10-23 DIAGNOSIS — M810 Age-related osteoporosis without current pathological fracture: Secondary | ICD-10-CM | POA: Diagnosis not present

## 2021-10-23 DIAGNOSIS — F411 Generalized anxiety disorder: Secondary | ICD-10-CM | POA: Diagnosis not present

## 2021-10-23 DIAGNOSIS — K5190 Ulcerative colitis, unspecified, without complications: Secondary | ICD-10-CM | POA: Diagnosis not present

## 2021-10-23 DIAGNOSIS — E785 Hyperlipidemia, unspecified: Secondary | ICD-10-CM | POA: Diagnosis not present

## 2021-10-23 DIAGNOSIS — R7301 Impaired fasting glucose: Secondary | ICD-10-CM | POA: Diagnosis not present

## 2021-11-15 DIAGNOSIS — M810 Age-related osteoporosis without current pathological fracture: Secondary | ICD-10-CM | POA: Diagnosis not present

## 2021-12-04 DIAGNOSIS — R7301 Impaired fasting glucose: Secondary | ICD-10-CM | POA: Diagnosis not present

## 2021-12-04 DIAGNOSIS — M810 Age-related osteoporosis without current pathological fracture: Secondary | ICD-10-CM | POA: Diagnosis not present

## 2021-12-04 DIAGNOSIS — K5790 Diverticulosis of intestine, part unspecified, without perforation or abscess without bleeding: Secondary | ICD-10-CM | POA: Diagnosis not present

## 2021-12-04 DIAGNOSIS — I10 Essential (primary) hypertension: Secondary | ICD-10-CM | POA: Diagnosis not present

## 2021-12-04 DIAGNOSIS — K5190 Ulcerative colitis, unspecified, without complications: Secondary | ICD-10-CM | POA: Diagnosis not present

## 2021-12-04 DIAGNOSIS — M25561 Pain in right knee: Secondary | ICD-10-CM | POA: Diagnosis not present

## 2021-12-04 DIAGNOSIS — E785 Hyperlipidemia, unspecified: Secondary | ICD-10-CM | POA: Diagnosis not present

## 2021-12-04 DIAGNOSIS — K219 Gastro-esophageal reflux disease without esophagitis: Secondary | ICD-10-CM | POA: Diagnosis not present

## 2021-12-04 DIAGNOSIS — F411 Generalized anxiety disorder: Secondary | ICD-10-CM | POA: Diagnosis not present

## 2022-04-08 ENCOUNTER — Telehealth: Payer: Self-pay | Admitting: Internal Medicine

## 2022-04-08 DIAGNOSIS — R195 Other fecal abnormalities: Secondary | ICD-10-CM

## 2022-04-08 DIAGNOSIS — D123 Benign neoplasm of transverse colon: Secondary | ICD-10-CM

## 2022-04-08 DIAGNOSIS — K5100 Ulcerative (chronic) pancolitis without complications: Secondary | ICD-10-CM

## 2022-04-08 MED ORDER — MESALAMINE 1.2 G PO TBEC: DELAYED_RELEASE_TABLET | ORAL | 0 refills | Status: DC

## 2022-04-08 NOTE — Telephone Encounter (Signed)
PT needs refill for mesalamine. It should be sent to [facility] [employer] [facility] [facility] Please advise.

## 2022-04-08 NOTE — Telephone Encounter (Signed)
Lialda refilled 

## 2022-05-02 ENCOUNTER — Other Ambulatory Visit: Payer: Self-pay | Admitting: Internal Medicine

## 2022-05-02 DIAGNOSIS — K5100 Ulcerative (chronic) pancolitis without complications: Secondary | ICD-10-CM

## 2022-05-02 DIAGNOSIS — R195 Other fecal abnormalities: Secondary | ICD-10-CM

## 2022-05-02 DIAGNOSIS — D123 Benign neoplasm of transverse colon: Secondary | ICD-10-CM

## 2022-05-03 DIAGNOSIS — M810 Age-related osteoporosis without current pathological fracture: Secondary | ICD-10-CM | POA: Diagnosis not present

## 2022-05-03 DIAGNOSIS — M25561 Pain in right knee: Secondary | ICD-10-CM | POA: Diagnosis not present

## 2022-05-03 DIAGNOSIS — F411 Generalized anxiety disorder: Secondary | ICD-10-CM | POA: Diagnosis not present

## 2022-05-03 DIAGNOSIS — K5190 Ulcerative colitis, unspecified, without complications: Secondary | ICD-10-CM | POA: Diagnosis not present

## 2022-05-03 DIAGNOSIS — I10 Essential (primary) hypertension: Secondary | ICD-10-CM | POA: Diagnosis not present

## 2022-05-03 DIAGNOSIS — K219 Gastro-esophageal reflux disease without esophagitis: Secondary | ICD-10-CM | POA: Diagnosis not present

## 2022-05-03 DIAGNOSIS — R7301 Impaired fasting glucose: Secondary | ICD-10-CM | POA: Diagnosis not present

## 2022-05-03 DIAGNOSIS — E785 Hyperlipidemia, unspecified: Secondary | ICD-10-CM | POA: Diagnosis not present

## 2022-05-09 DIAGNOSIS — H5213 Myopia, bilateral: Secondary | ICD-10-CM | POA: Diagnosis not present

## 2022-06-28 ENCOUNTER — Other Ambulatory Visit: Payer: Self-pay | Admitting: Internal Medicine

## 2022-06-28 DIAGNOSIS — K5100 Ulcerative (chronic) pancolitis without complications: Secondary | ICD-10-CM

## 2022-06-28 DIAGNOSIS — D123 Benign neoplasm of transverse colon: Secondary | ICD-10-CM

## 2022-06-28 DIAGNOSIS — R195 Other fecal abnormalities: Secondary | ICD-10-CM

## 2022-08-01 ENCOUNTER — Other Ambulatory Visit: Payer: Self-pay | Admitting: Internal Medicine

## 2022-08-01 DIAGNOSIS — D123 Benign neoplasm of transverse colon: Secondary | ICD-10-CM

## 2022-08-01 DIAGNOSIS — K5100 Ulcerative (chronic) pancolitis without complications: Secondary | ICD-10-CM

## 2022-08-01 DIAGNOSIS — R195 Other fecal abnormalities: Secondary | ICD-10-CM

## 2022-09-26 ENCOUNTER — Other Ambulatory Visit: Payer: Self-pay | Admitting: Internal Medicine

## 2022-09-26 DIAGNOSIS — R195 Other fecal abnormalities: Secondary | ICD-10-CM

## 2022-09-26 DIAGNOSIS — K5100 Ulcerative (chronic) pancolitis without complications: Secondary | ICD-10-CM

## 2022-09-26 DIAGNOSIS — D123 Benign neoplasm of transverse colon: Secondary | ICD-10-CM

## 2022-10-26 DIAGNOSIS — Z23 Encounter for immunization: Secondary | ICD-10-CM | POA: Diagnosis not present

## 2022-11-19 DIAGNOSIS — M810 Age-related osteoporosis without current pathological fracture: Secondary | ICD-10-CM | POA: Diagnosis not present

## 2022-11-19 DIAGNOSIS — Z1389 Encounter for screening for other disorder: Secondary | ICD-10-CM | POA: Diagnosis not present

## 2022-11-19 DIAGNOSIS — Z1212 Encounter for screening for malignant neoplasm of rectum: Secondary | ICD-10-CM | POA: Diagnosis not present

## 2022-11-19 DIAGNOSIS — E785 Hyperlipidemia, unspecified: Secondary | ICD-10-CM | POA: Diagnosis not present

## 2022-11-19 DIAGNOSIS — I10 Essential (primary) hypertension: Secondary | ICD-10-CM | POA: Diagnosis not present

## 2022-11-25 DIAGNOSIS — R82998 Other abnormal findings in urine: Secondary | ICD-10-CM | POA: Diagnosis not present

## 2022-11-25 DIAGNOSIS — Z1331 Encounter for screening for depression: Secondary | ICD-10-CM | POA: Diagnosis not present

## 2022-11-25 DIAGNOSIS — Z0000 Encounter for general adult medical examination without abnormal findings: Secondary | ICD-10-CM | POA: Diagnosis not present

## 2022-11-25 DIAGNOSIS — K219 Gastro-esophageal reflux disease without esophagitis: Secondary | ICD-10-CM | POA: Diagnosis not present

## 2022-11-25 DIAGNOSIS — K5190 Ulcerative colitis, unspecified, without complications: Secondary | ICD-10-CM | POA: Diagnosis not present

## 2022-11-25 DIAGNOSIS — M810 Age-related osteoporosis without current pathological fracture: Secondary | ICD-10-CM | POA: Diagnosis not present

## 2022-11-25 DIAGNOSIS — Z1339 Encounter for screening examination for other mental health and behavioral disorders: Secondary | ICD-10-CM | POA: Diagnosis not present

## 2022-11-25 DIAGNOSIS — E785 Hyperlipidemia, unspecified: Secondary | ICD-10-CM | POA: Diagnosis not present

## 2022-11-25 DIAGNOSIS — F411 Generalized anxiety disorder: Secondary | ICD-10-CM | POA: Diagnosis not present

## 2022-11-25 DIAGNOSIS — I10 Essential (primary) hypertension: Secondary | ICD-10-CM | POA: Diagnosis not present

## 2022-11-25 DIAGNOSIS — R7301 Impaired fasting glucose: Secondary | ICD-10-CM | POA: Diagnosis not present

## 2022-11-28 DIAGNOSIS — Z1389 Encounter for screening for other disorder: Secondary | ICD-10-CM | POA: Diagnosis not present

## 2022-11-28 DIAGNOSIS — I10 Essential (primary) hypertension: Secondary | ICD-10-CM | POA: Diagnosis not present

## 2022-11-28 DIAGNOSIS — E785 Hyperlipidemia, unspecified: Secondary | ICD-10-CM | POA: Diagnosis not present

## 2022-11-28 DIAGNOSIS — M810 Age-related osteoporosis without current pathological fracture: Secondary | ICD-10-CM | POA: Diagnosis not present

## 2022-11-28 DIAGNOSIS — Z1212 Encounter for screening for malignant neoplasm of rectum: Secondary | ICD-10-CM | POA: Diagnosis not present

## 2023-02-05 ENCOUNTER — Telehealth: Payer: Self-pay | Admitting: Internal Medicine

## 2023-02-05 DIAGNOSIS — R195 Other fecal abnormalities: Secondary | ICD-10-CM

## 2023-02-05 DIAGNOSIS — K5100 Ulcerative (chronic) pancolitis without complications: Secondary | ICD-10-CM

## 2023-02-05 DIAGNOSIS — D123 Benign neoplasm of transverse colon: Secondary | ICD-10-CM

## 2023-02-05 NOTE — Telephone Encounter (Signed)
 Patient has been scheduled for OV with Santina Cull on 2/7 at 10:00 for medication refill. Patient is needing refill for Mesalamine . Please advise.

## 2023-02-05 NOTE — Telephone Encounter (Signed)
 PT returning call to ask that we please call her to let us  know when the prescription has been sent in.

## 2023-02-06 MED ORDER — MESALAMINE 1.2 G PO TBEC
DELAYED_RELEASE_TABLET | ORAL | 1 refills | Status: DC
Start: 2023-02-06 — End: 2023-09-25

## 2023-02-06 NOTE — Telephone Encounter (Signed)
Lialda refilled - mychart message sent to patient to let her know.

## 2023-02-18 DIAGNOSIS — R7301 Impaired fasting glucose: Secondary | ICD-10-CM | POA: Diagnosis not present

## 2023-02-18 DIAGNOSIS — K5790 Diverticulosis of intestine, part unspecified, without perforation or abscess without bleeding: Secondary | ICD-10-CM | POA: Diagnosis not present

## 2023-02-18 DIAGNOSIS — K219 Gastro-esophageal reflux disease without esophagitis: Secondary | ICD-10-CM | POA: Diagnosis not present

## 2023-02-18 DIAGNOSIS — F411 Generalized anxiety disorder: Secondary | ICD-10-CM | POA: Diagnosis not present

## 2023-02-18 DIAGNOSIS — E785 Hyperlipidemia, unspecified: Secondary | ICD-10-CM | POA: Diagnosis not present

## 2023-02-18 DIAGNOSIS — N390 Urinary tract infection, site not specified: Secondary | ICD-10-CM | POA: Diagnosis not present

## 2023-02-18 DIAGNOSIS — K5190 Ulcerative colitis, unspecified, without complications: Secondary | ICD-10-CM | POA: Diagnosis not present

## 2023-02-18 DIAGNOSIS — M810 Age-related osteoporosis without current pathological fracture: Secondary | ICD-10-CM | POA: Diagnosis not present

## 2023-02-18 DIAGNOSIS — I10 Essential (primary) hypertension: Secondary | ICD-10-CM | POA: Diagnosis not present

## 2023-02-28 ENCOUNTER — Ambulatory Visit: Payer: Medicare HMO | Admitting: Physician Assistant

## 2023-03-03 ENCOUNTER — Ambulatory Visit: Payer: Medicare HMO | Admitting: Gastroenterology

## 2023-05-27 DIAGNOSIS — I10 Essential (primary) hypertension: Secondary | ICD-10-CM | POA: Diagnosis not present

## 2023-05-27 DIAGNOSIS — R7301 Impaired fasting glucose: Secondary | ICD-10-CM | POA: Diagnosis not present

## 2023-05-27 DIAGNOSIS — F411 Generalized anxiety disorder: Secondary | ICD-10-CM | POA: Diagnosis not present

## 2023-05-27 DIAGNOSIS — K219 Gastro-esophageal reflux disease without esophagitis: Secondary | ICD-10-CM | POA: Diagnosis not present

## 2023-05-27 DIAGNOSIS — K5790 Diverticulosis of intestine, part unspecified, without perforation or abscess without bleeding: Secondary | ICD-10-CM | POA: Diagnosis not present

## 2023-05-27 DIAGNOSIS — K5190 Ulcerative colitis, unspecified, without complications: Secondary | ICD-10-CM | POA: Diagnosis not present

## 2023-05-27 DIAGNOSIS — M810 Age-related osteoporosis without current pathological fracture: Secondary | ICD-10-CM | POA: Diagnosis not present

## 2023-05-27 DIAGNOSIS — E785 Hyperlipidemia, unspecified: Secondary | ICD-10-CM | POA: Diagnosis not present

## 2023-06-27 DIAGNOSIS — R7301 Impaired fasting glucose: Secondary | ICD-10-CM | POA: Diagnosis not present

## 2023-06-27 DIAGNOSIS — I10 Essential (primary) hypertension: Secondary | ICD-10-CM | POA: Diagnosis not present

## 2023-06-27 DIAGNOSIS — E785 Hyperlipidemia, unspecified: Secondary | ICD-10-CM | POA: Diagnosis not present

## 2023-06-27 DIAGNOSIS — K5190 Ulcerative colitis, unspecified, without complications: Secondary | ICD-10-CM | POA: Diagnosis not present

## 2023-06-27 DIAGNOSIS — K219 Gastro-esophageal reflux disease without esophagitis: Secondary | ICD-10-CM | POA: Diagnosis not present

## 2023-06-27 DIAGNOSIS — K5790 Diverticulosis of intestine, part unspecified, without perforation or abscess without bleeding: Secondary | ICD-10-CM | POA: Diagnosis not present

## 2023-06-27 DIAGNOSIS — M810 Age-related osteoporosis without current pathological fracture: Secondary | ICD-10-CM | POA: Diagnosis not present

## 2023-06-27 DIAGNOSIS — F411 Generalized anxiety disorder: Secondary | ICD-10-CM | POA: Diagnosis not present

## 2023-07-03 DIAGNOSIS — H5213 Myopia, bilateral: Secondary | ICD-10-CM | POA: Diagnosis not present

## 2023-07-03 DIAGNOSIS — H524 Presbyopia: Secondary | ICD-10-CM | POA: Diagnosis not present

## 2023-07-03 DIAGNOSIS — H59813 Chorioretinal scars after surgery for detachment, bilateral: Secondary | ICD-10-CM | POA: Diagnosis not present

## 2023-07-29 ENCOUNTER — Other Ambulatory Visit: Payer: Self-pay | Admitting: Internal Medicine

## 2023-07-29 DIAGNOSIS — K5100 Ulcerative (chronic) pancolitis without complications: Secondary | ICD-10-CM

## 2023-07-29 DIAGNOSIS — R195 Other fecal abnormalities: Secondary | ICD-10-CM

## 2023-07-29 DIAGNOSIS — D123 Benign neoplasm of transverse colon: Secondary | ICD-10-CM

## 2023-08-20 DIAGNOSIS — I10 Essential (primary) hypertension: Secondary | ICD-10-CM | POA: Diagnosis not present

## 2023-08-20 DIAGNOSIS — K219 Gastro-esophageal reflux disease without esophagitis: Secondary | ICD-10-CM | POA: Diagnosis not present

## 2023-08-20 DIAGNOSIS — K5790 Diverticulosis of intestine, part unspecified, without perforation or abscess without bleeding: Secondary | ICD-10-CM | POA: Diagnosis not present

## 2023-08-20 DIAGNOSIS — R7301 Impaired fasting glucose: Secondary | ICD-10-CM | POA: Diagnosis not present

## 2023-08-20 DIAGNOSIS — K5190 Ulcerative colitis, unspecified, without complications: Secondary | ICD-10-CM | POA: Diagnosis not present

## 2023-08-20 DIAGNOSIS — M810 Age-related osteoporosis without current pathological fracture: Secondary | ICD-10-CM | POA: Diagnosis not present

## 2023-08-20 DIAGNOSIS — F411 Generalized anxiety disorder: Secondary | ICD-10-CM | POA: Diagnosis not present

## 2023-08-20 DIAGNOSIS — E785 Hyperlipidemia, unspecified: Secondary | ICD-10-CM | POA: Diagnosis not present

## 2023-09-16 ENCOUNTER — Other Ambulatory Visit: Payer: Self-pay | Admitting: Internal Medicine

## 2023-09-16 DIAGNOSIS — R195 Other fecal abnormalities: Secondary | ICD-10-CM

## 2023-09-16 DIAGNOSIS — K5100 Ulcerative (chronic) pancolitis without complications: Secondary | ICD-10-CM

## 2023-09-16 DIAGNOSIS — D123 Benign neoplasm of transverse colon: Secondary | ICD-10-CM

## 2023-09-23 ENCOUNTER — Other Ambulatory Visit: Payer: Self-pay | Admitting: Internal Medicine

## 2023-09-23 DIAGNOSIS — K5100 Ulcerative (chronic) pancolitis without complications: Secondary | ICD-10-CM

## 2023-09-23 DIAGNOSIS — D123 Benign neoplasm of transverse colon: Secondary | ICD-10-CM

## 2023-09-23 DIAGNOSIS — R195 Other fecal abnormalities: Secondary | ICD-10-CM

## 2023-09-25 ENCOUNTER — Telehealth: Payer: Self-pay | Admitting: Internal Medicine

## 2023-09-25 ENCOUNTER — Other Ambulatory Visit: Payer: Self-pay | Admitting: Internal Medicine

## 2023-09-25 DIAGNOSIS — R195 Other fecal abnormalities: Secondary | ICD-10-CM

## 2023-09-25 DIAGNOSIS — D123 Benign neoplasm of transverse colon: Secondary | ICD-10-CM

## 2023-09-25 DIAGNOSIS — K5100 Ulcerative (chronic) pancolitis without complications: Secondary | ICD-10-CM

## 2023-09-25 MED ORDER — MESALAMINE 1.2 G PO TBEC
DELAYED_RELEASE_TABLET | ORAL | 1 refills | Status: DC
Start: 2023-09-25 — End: 2023-11-17

## 2023-09-25 NOTE — Telephone Encounter (Signed)
 Patient is requesting Lialda  refill. She has not been seen in two years and refill had been denied. Patient scheduled an appointment for next available but is wanting to know if she can have enough medication to last her until she is seen. [facility] [address].

## 2023-09-25 NOTE — Telephone Encounter (Signed)
 Rx sent for #60 tablets, 1 refill until 11/26/23 GI appointment. No further refills until patient is seen in office.

## 2023-11-10 DIAGNOSIS — F329 Major depressive disorder, single episode, unspecified: Secondary | ICD-10-CM | POA: Diagnosis not present

## 2023-11-10 DIAGNOSIS — R634 Abnormal weight loss: Secondary | ICD-10-CM | POA: Diagnosis not present

## 2023-11-10 DIAGNOSIS — F411 Generalized anxiety disorder: Secondary | ICD-10-CM | POA: Diagnosis not present

## 2023-11-10 DIAGNOSIS — F331 Major depressive disorder, recurrent, moderate: Secondary | ICD-10-CM | POA: Diagnosis not present

## 2023-11-10 DIAGNOSIS — M810 Age-related osteoporosis without current pathological fracture: Secondary | ICD-10-CM | POA: Diagnosis not present

## 2023-11-10 DIAGNOSIS — I10 Essential (primary) hypertension: Secondary | ICD-10-CM | POA: Diagnosis not present

## 2023-11-17 ENCOUNTER — Other Ambulatory Visit: Payer: Self-pay | Admitting: Internal Medicine

## 2023-11-17 DIAGNOSIS — D123 Benign neoplasm of transverse colon: Secondary | ICD-10-CM

## 2023-11-17 DIAGNOSIS — R195 Other fecal abnormalities: Secondary | ICD-10-CM

## 2023-11-17 DIAGNOSIS — K5100 Ulcerative (chronic) pancolitis without complications: Secondary | ICD-10-CM

## 2023-11-25 DIAGNOSIS — R7301 Impaired fasting glucose: Secondary | ICD-10-CM | POA: Diagnosis not present

## 2023-11-25 DIAGNOSIS — K219 Gastro-esophageal reflux disease without esophagitis: Secondary | ICD-10-CM | POA: Diagnosis not present

## 2023-11-25 DIAGNOSIS — E785 Hyperlipidemia, unspecified: Secondary | ICD-10-CM | POA: Diagnosis not present

## 2023-11-25 DIAGNOSIS — Z1212 Encounter for screening for malignant neoplasm of rectum: Secondary | ICD-10-CM | POA: Diagnosis not present

## 2023-11-25 DIAGNOSIS — I10 Essential (primary) hypertension: Secondary | ICD-10-CM | POA: Diagnosis not present

## 2023-11-26 ENCOUNTER — Ambulatory Visit: Admitting: Gastroenterology

## 2023-11-26 ENCOUNTER — Encounter: Payer: Self-pay | Admitting: Gastroenterology

## 2023-11-26 VITALS — BP 126/78 | HR 50 | Ht 65.0 in | Wt 125.0 lb

## 2023-11-26 DIAGNOSIS — R195 Other fecal abnormalities: Secondary | ICD-10-CM

## 2023-11-26 DIAGNOSIS — K5100 Ulcerative (chronic) pancolitis without complications: Secondary | ICD-10-CM | POA: Diagnosis not present

## 2023-11-26 DIAGNOSIS — D123 Benign neoplasm of transverse colon: Secondary | ICD-10-CM | POA: Diagnosis not present

## 2023-11-26 MED ORDER — MESALAMINE 1.2 G PO TBEC: DELAYED_RELEASE_TABLET | ORAL | 1 refills | Status: AC

## 2023-11-26 NOTE — Progress Notes (Signed)
 Chief Complaint: Medication refill Primary GI MD: Dr. Abran  HPI: Discussed the use of AI scribe software for clinical note transcription with the patient, who gave verbal consent to proceed.  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 She is currently on mesalamine  2.4 grams daily. She experiences soft, formed stools once or twice daily without any abdominal pain, nausea, or vomiting. No previous fecal calprotectin  She has experienced a gradual weight loss from 152 pounds in 2022 to 125 pounds currently. This weight loss is attributed to stress-related decreased appetite, as she mentions being 'super, super nervous' and not eating when stressed. She is in the process of finding someone to talk to about her stressors.  She is also on omeprazole, which she believes is working well. She declined recent lab work due to concerns about potential costs.  Wt Readings from Last 3 Encounters:  11/26/23 125 lb (56.7 kg)  03/22/21 146 lb (66.2 kg)  03/02/20 152 lb (68.9 kg)     Past Medical History:  Diagnosis Date   Adrenal mass, right    Anxiety    Depression    Diverticulosis    GERD (gastroesophageal reflux disease)    Hyperlipidemia    IBS (irritable bowel syndrome)    Osteoporosis    UC (ulcerative colitis) (HCC)    UTI (urinary tract infection)     Past Surgical History:  Procedure Laterality Date   BREAST SURGERY Left    cyst removal   cataracts     COLONOSCOPY      Current Outpatient Medications  Medication Sig Dispense Refill   alendronate (FOSAMAX) 70 MG tablet Take 70 mg by mouth once a week. Take with a full glass of water on an empty stomach.     ALPRAZolam (XANAX) 0.5 MG tablet Take 0.5-1 mg by mouth every 6 (six) hours. (Patient not taking: Reported on 11/26/2023)     atenolol (TENORMIN) 50  MG tablet Take 50 mg by mouth daily.     busPIRone (BUSPAR) 15 MG tablet Take 15 mg by mouth 2 (two) times daily.     Calcium Carbonate (CALCIUM 600 PO) Take 1 tablet by mouth 2 (two) times daily.     colestipol  (COLESTID ) 1 g tablet TAKE 1 TABLET BY MOUTH 2 TIMES DAILY. 60 tablet 2   estradiol (ESTRACE) 0.1 MG/GM vaginal cream Place 1 Applicatorful vaginally at bedtime. (Patient not taking: Reported on 11/26/2023)     hydrocortisone  (ANUSOL -HC) 2.5 % rectal cream Place 1 application. rectally 2 (two) times daily. 30 g 1   mesalamine  (LIALDA ) 1.2 g EC tablet TAKE 2 TABLETS BY MOUTH EVERY MORNING OFFICE VISIT FOR FURTHER REFILLS!!!!! 60 tablet 1   omeprazole (PRILOSEC) 20 MG capsule Take 20 mg by mouth daily.     sertraline (ZOLOFT) 100 MG tablet Take 100 mg by mouth daily.     TRIMETHOPRIM PO Take by mouth daily. Takes 2 tablets daily     verapamil (VERELAN PM) 240 MG 24 hr capsule Take 240 mg by mouth at bedtime.     Current Facility-Administered Medications  Medication Dose Route Frequency Provider Last Rate Last Admin   0.9 %  sodium chloride  infusion  500 mL Intravenous Continuous Teressa Toribio SQUIBB, MD        Allergies as of 11/26/2023 - Review Complete 11/26/2023  Allergen  Reaction Noted   Demerol [meperidine]  07/02/2016   Hydrocodone-acetaminophen  08/28/2009   Meperidine hcl  08/28/2009   Vicodin [hydrocodone-acetaminophen]  07/02/2016    Family History  Problem Relation Age of Onset   Hypertension Mother    CVA Mother    Parkinson's disease Father    Diabetes Father    Dementia Brother    Bladder Cancer Brother    Colon cancer Neg Hx    Breast cancer Neg Hx     Social History   Socioeconomic History   Marital status: Married    Spouse name: Not on file   Number of children: Not on file   Years of education: Not on file   Highest education level: Not on file  Occupational History   Not on file  Tobacco Use   Smoking status: Former    Current packs/day: 0.00     Types: Cigarettes    Quit date: 04/13/2007    Years since quitting: 16.6   Smokeless tobacco: Never  Substance and Sexual Activity   Alcohol  use: Yes   Drug use: Not on file   Sexual activity: Not Currently  Other Topics Concern   Not on file  Social History Narrative   Not on file   Social Drivers of Health   Financial Resource Strain: Not on file  Food Insecurity: Not on file  Transportation Needs: Not on file  Physical Activity: Not on file  Stress: Not on file  Social Connections: Not on file  Intimate Partner Violence: Not on file    Review of Systems:    Constitutional: No weight loss, fever, chills, weakness or fatigue HEENT: Eyes: No change in vision               Ears, Nose, Throat:  No change in hearing or congestion Skin: No rash or itching Cardiovascular: No chest pain, chest pressure or palpitations   Respiratory: No SOB or cough Gastrointestinal: See HPI and otherwise negative Genitourinary: No dysuria or change in urinary frequency Neurological: No headache, dizziness or syncope Musculoskeletal: No new muscle or joint pain Hematologic: No bleeding or bruising Psychiatric: No history of depression or anxiety    Physical Exam:  Vital signs: BP 126/78   Pulse (!) 50   Ht 5' 5 (1.651 m)   Wt 125 lb (56.7 kg)   BMI 20.80 kg/m   Constitutional: NAD, alert and cooperative Head:  Normocephalic and atraumatic. Eyes:   PEERL, EOMI. No icterus. Conjunctiva pink. Respiratory: Respirations even and unlabored. Lungs clear to auscultation bilaterally.   No wheezes, crackles, or rhonchi.  Cardiovascular:  Regular rate and rhythm. No peripheral edema, cyanosis or pallor.  Gastrointestinal:  Soft, nondistended, nontender. No rebound or guarding. Normal bowel sounds. No appreciable masses or hepatomegaly. Rectal:  Declines Msk:  Symmetrical without gross deformities. Without edema, no deformity or joint abnormality.  Neurologic:  Alert and  oriented x4;  grossly  normal neurologically.  Skin:   Dry and intact without significant lesions or rashes. Psychiatric: Oriented to person, place and time. Demonstrates good judgement and reason without abnormal affect or behaviors.   RELEVANT LABS AND IMAGING: CBC    Component Value Date/Time   WBC 13.3 (H) 05/01/2017 0624   RBC 4.32 05/01/2017 0624   HGB 13.1 05/01/2017 0624   HCT 38.9 05/01/2017 0624   PLT 285 05/01/2017 0624   MCV 90.0 05/01/2017 0624   MCH 30.3 05/01/2017 0624   MCHC 33.7 05/01/2017 0624   RDW 13.0 05/01/2017  9375   LYMPHSABS 0.4 (L) 05/01/2017 0624   MONOABS 0.7 05/01/2017 0624   EOSABS 0.0 05/01/2017 0624   BASOSABS 0.0 05/01/2017 0624    CMP     Component Value Date/Time   NA 136 05/01/2017 0624   K 3.5 05/01/2017 0624   CL 99 (L) 05/01/2017 0624   CO2 24 05/01/2017 0624   GLUCOSE 164 (H) 05/01/2017 0624   BUN 31 (H) 05/01/2017 0624   CREATININE 1.14 (H) 05/01/2017 0624   CALCIUM 9.1 05/01/2017 0624   GFRNONAA 47 (L) 05/01/2017 0624   GFRAA 54 (L) 05/01/2017 9375     Assessment/Plan:    Proctosigmoiditis In clinical remission on Lialda  2.4 g daily.  Having 1-2 soft formed bowel movements per day.  She is not interested in repeat colonoscopy at this time. - Patient politely declines labs today - Refill Lialda  2.4 G daily - Surveillance colonoscopy which was due April 2023.  I offered to have this scheduled today and she politely declined and she is not interested in follow-up colonoscopy at this time.  Discussed if she has recurrence of symptoms would likely need to pursue colonoscopy  History of adenomatous colon polyp Last colonoscopy April 2018.  Patient is not interested in repeat today.  GERD Well-controlled on omeprazole 20 mg daily  Anxiety Patient describes intense anxiety in the medical setting and in her personal life which often results in decreased appetite and has produced a gradual weight loss of 25 pounds over the last 3 years.  She is going  to see a therapist soon to help with her anxiety.   Nestor Mollie RIGGERS [facility] 11/26/2023, 10:36 AM  Cc: Avva, Ravisankar, MD

## 2023-11-26 NOTE — Progress Notes (Signed)
 Noted

## 2023-11-26 NOTE — Patient Instructions (Signed)
 We have sent the following medications to your pharmacy for you to pick up at your convenience:  Lialda   Please follow up in one year.  _______________________________________________________  If your blood pressure at your visit was 140/90 or greater, please contact your primary care physician to follow up on this.  _______________________________________________________  If you are age 79 or older, your body mass index should be between 23-30. Your Body mass index is 20.8 kg/m. If this is out of the aforementioned range listed, please consider follow up with your Primary Care Provider.  If you are age 67 or younger, your body mass index should be between 19-25. Your Body mass index is 20.8 kg/m. If this is out of the aformentioned range listed, please consider follow up with your Primary Care Provider.   ________________________________________________________  The [facility] providers would like to encourage you to use MYCHART to communicate with providers for non-urgent requests or questions.  Due to long hold times on the telephone, sending your provider a message by [facility] may be a faster and more efficient way to get a response.  Please allow 48 business hours for a response.  Please remember that this is for non-urgent requests.  _______________________________________________________  [facility] is using a team-based approach to care.  Your team is made up of your doctor and two to three APPS. Our APPS (Nurse Practitioners and Physician Assistants) work with your physician to ensure care continuity for you. They are fully qualified to address your health concerns and develop a treatment plan. They communicate directly with your gastroenterologist to care for you. Seeing the Advanced Practice Practitioners on your physician's team can help you by facilitating care more promptly, often allowing for earlier appointments, access to diagnostic testing, procedures, and other  specialty referrals.

## 2023-12-01 DIAGNOSIS — E785 Hyperlipidemia, unspecified: Secondary | ICD-10-CM | POA: Diagnosis not present

## 2023-12-02 DIAGNOSIS — I10 Essential (primary) hypertension: Secondary | ICD-10-CM | POA: Diagnosis not present

## 2023-12-02 DIAGNOSIS — F411 Generalized anxiety disorder: Secondary | ICD-10-CM | POA: Diagnosis not present

## 2023-12-02 DIAGNOSIS — Z0000 Encounter for general adult medical examination without abnormal findings: Secondary | ICD-10-CM | POA: Diagnosis not present

## 2023-12-02 DIAGNOSIS — Z1339 Encounter for screening examination for other mental health and behavioral disorders: Secondary | ICD-10-CM | POA: Diagnosis not present

## 2023-12-02 DIAGNOSIS — M810 Age-related osteoporosis without current pathological fracture: Secondary | ICD-10-CM | POA: Diagnosis not present

## 2023-12-02 DIAGNOSIS — Z1331 Encounter for screening for depression: Secondary | ICD-10-CM | POA: Diagnosis not present

## 2023-12-02 DIAGNOSIS — R82998 Other abnormal findings in urine: Secondary | ICD-10-CM | POA: Diagnosis not present

## 2023-12-02 DIAGNOSIS — K5190 Ulcerative colitis, unspecified, without complications: Secondary | ICD-10-CM | POA: Diagnosis not present

## 2023-12-02 DIAGNOSIS — R634 Abnormal weight loss: Secondary | ICD-10-CM | POA: Diagnosis not present

## 2023-12-04 DIAGNOSIS — F4323 Adjustment disorder with mixed anxiety and depressed mood: Secondary | ICD-10-CM | POA: Diagnosis not present

## 2024-01-06 ENCOUNTER — Encounter (HOSPITAL_BASED_OUTPATIENT_CLINIC_OR_DEPARTMENT_OTHER): Payer: Self-pay

## 2024-01-06 ENCOUNTER — Other Ambulatory Visit: Payer: Self-pay

## 2024-01-06 ENCOUNTER — Emergency Department (HOSPITAL_BASED_OUTPATIENT_CLINIC_OR_DEPARTMENT_OTHER)
Admission: EM | Admit: 2024-01-06 | Discharge: 2024-01-06 | Disposition: A | Discharge status: Home / Self Care | Attending: Emergency Medicine | Admitting: Emergency Medicine

## 2024-01-06 ENCOUNTER — Emergency Department (HOSPITAL_BASED_OUTPATIENT_CLINIC_OR_DEPARTMENT_OTHER)

## 2024-01-06 DIAGNOSIS — I491 Atrial premature depolarization: Secondary | ICD-10-CM | POA: Insufficient documentation

## 2024-01-06 DIAGNOSIS — R404 Transient alteration of awareness: Secondary | ICD-10-CM | POA: Diagnosis not present

## 2024-01-06 DIAGNOSIS — I6782 Cerebral ischemia: Secondary | ICD-10-CM | POA: Insufficient documentation

## 2024-01-06 DIAGNOSIS — R4182 Altered mental status, unspecified: Secondary | ICD-10-CM | POA: Diagnosis present

## 2024-01-06 LAB — CBC
HCT: 41.6 % (ref 36.0–46.0)
Hemoglobin: 14.8 g/dL (ref 12.0–15.0)
MCH: 32.4 pg (ref 26.0–34.0)
MCHC: 35.6 g/dL (ref 30.0–36.0)
MCV: 91 fL (ref 80.0–100.0)
Platelets: 298 K/uL (ref 150–400)
RBC: 4.57 MIL/uL (ref 3.87–5.11)
RDW: 11.8 % (ref 11.5–15.5)
WBC: 10.9 K/uL — ABNORMAL HIGH (ref 4.0–10.5)
nRBC: 0 % (ref 0.0–0.2)

## 2024-01-06 LAB — URINALYSIS, ROUTINE W REFLEX MICROSCOPIC
Bilirubin Urine: NEGATIVE
Glucose, UA: NEGATIVE mg/dL
Hgb urine dipstick: NEGATIVE
Ketones, ur: NEGATIVE mg/dL
Leukocytes,Ua: NEGATIVE
Nitrite: NEGATIVE
Protein, ur: NEGATIVE mg/dL
Specific Gravity, Urine: 1.01 (ref 1.005–1.030)
pH: 6.5 (ref 5.0–8.0)

## 2024-01-06 LAB — DIFFERENTIAL
Abs Immature Granulocytes: 0.07 K/uL (ref 0.00–0.07)
Basophils Absolute: 0 K/uL (ref 0.0–0.1)
Basophils Relative: 0 %
Eosinophils Absolute: 0.1 K/uL (ref 0.0–0.5)
Eosinophils Relative: 1 %
Immature Granulocytes: 1 %
Lymphocytes Relative: 14 %
Lymphs Abs: 1.6 K/uL (ref 0.7–4.0)
Monocytes Absolute: 0.9 K/uL (ref 0.1–1.0)
Monocytes Relative: 8 %
Neutro Abs: 8.3 K/uL — ABNORMAL HIGH (ref 1.7–7.7)
Neutrophils Relative %: 76 %

## 2024-01-06 LAB — COMPREHENSIVE METABOLIC PANEL WITH GFR
ALT: 32 U/L (ref 0–44)
AST: 24 U/L (ref 15–41)
Albumin: 4.7 g/dL (ref 3.5–5.0)
Alkaline Phosphatase: 79 U/L (ref 38–126)
Anion gap: 12 (ref 5–15)
BUN: 15 mg/dL (ref 8–23)
CO2: 28 mmol/L (ref 22–32)
Calcium: 10.5 mg/dL — ABNORMAL HIGH (ref 8.9–10.3)
Chloride: 100 mmol/L (ref 98–111)
Creatinine, Ser: 0.7 mg/dL (ref 0.44–1.00)
GFR, Estimated: >60 mL/min (ref >60)
Glucose, Bld: 107 mg/dL — ABNORMAL HIGH (ref 70–99)
Potassium: 4 mmol/L (ref 3.5–5.1)
Sodium: 140 mmol/L (ref 135–145)
Total Bilirubin: 0.8 mg/dL (ref 0.0–1.2)
Total Protein: 7.5 g/dL (ref 6.5–8.1)

## 2024-01-06 LAB — PROTIME-INR
INR: 1 (ref 0.8–1.2)
Prothrombin Time: 13.4 s (ref 11.4–15.2)

## 2024-01-06 LAB — APTT: aPTT: 26 s (ref 24–36)

## 2024-01-06 LAB — ETHANOL: Alcohol, Ethyl (B): <15 mg/dL (ref <15)

## 2024-01-06 LAB — CBG MONITORING, ED: Glucose-Capillary: 90 mg/dL (ref 70–99)

## 2024-01-06 MED ORDER — SODIUM CHLORIDE 0.9% FLUSH: 3.0000 mL | Freq: Once | INTRAVENOUS | Status: DC

## 2024-01-06 NOTE — Discharge Instructions (Addendum)
 Please use caution with your home Xanax.  Please revert back to your typical dose of 0.5 mg until you can speak with your primary doctor.  Return for fevers, chills, severe headache, vision loss, facial droop, unilateral weakness, word finding difficulty, chest pain, shortness of breath, or any new or worsening symptoms that are concerning to you.

## 2024-01-06 NOTE — Consult Note (Incomplete)
 LKW 0945  Off balance Generalized weakness  NEUROLOGY TELECONSULTATION NOTE   Date of service: January 06, 2024 Patient Name: Rhonda Woods MRN:  [id number] DOB:  [birth year] Reason for consult: telestroke  Requesting Provider: Dr. Caron Salt Consult Participants: marlyn, bedside RN, patient RN Location of the provider: *** Location of the patient: ***  This consult was provided via telemedicine with 2-way video and audio communication. The patient/family was informed that care would be provided in this way and agreed to receive care in this manner.   _ _ _   _ __   _ __ _ _  __ __   _ __   __ _  History of Present Illness   Rhonda Woods is a 78 y.o. female with PMH significant for  has a past medical history of Adrenal mass, right, Anxiety, Depression, Diverticulosis, GERD (gastroesophageal reflux disease), Hyperlipidemia, IBS (irritable bowel syndrome), Osteoporosis, UC (ulcerative colitis) (HCC), and UTI (urinary tract infection). who presents with  ***   ROS   Per HPI; all other systems reviewed and are negative  Past History   The following was personally reviewed:  Past Medical History:  Diagnosis Date   Adrenal mass, right    Anxiety    Depression    Diverticulosis    GERD (gastroesophageal reflux disease)    Hyperlipidemia    IBS (irritable bowel syndrome)    Osteoporosis    UC (ulcerative colitis) (HCC)    UTI (urinary tract infection)    Past Surgical History:  Procedure Laterality Date   BREAST SURGERY Left    cyst removal   cataracts     COLONOSCOPY     Family History  Problem Relation Age of Onset   Hypertension Mother    CVA Mother    Parkinson's disease Father    Diabetes Father    Dementia Brother    Bladder Cancer Brother    Colon cancer Neg Hx    Breast cancer Neg Hx    Social History   Socioeconomic History   Marital status: Married    Spouse name: Not on file   Number of children: Not on file   Years of education: Not on  file   Highest education level: Not on file  Occupational History   Not on file  Tobacco Use   Smoking status: Former    Current packs/day: 0.00    Types: Cigarettes    Quit date: 04/13/2007    Years since quitting: 16.7   Smokeless tobacco: Never  Substance and Sexual Activity   Alcohol  use: Yes   Drug use: Not on file   Sexual activity: Not Currently  Other Topics Concern   Not on file  Social History Narrative   Not on file   Social Drivers of Health   Tobacco Use: Medium Risk (01/06/2024)   Patient History    Smoking Tobacco Use: Former    Smokeless Tobacco Use: Never    Passive Exposure: Not on Actuary Strain: Not on file  Food Insecurity: Not on file  Transportation Needs: Not on file  Physical Activity: Not on file  Stress: Not on file  Social Connections: Not on file  Depression (EYV7-0): Not on file  Alcohol  Screen: Not on file  Housing: Not on file  Utilities: Not on file  Health Literacy: Not on file   Allergies[1]  Medications   (Not in a Woods admission)    Current Medications[2]  Vitals   Vitals:  01/06/24 1345  BP: (!) 171/87  Pulse: (!) 56  Resp: 16  Temp: (!) 97.5 F (36.4 C)  SpO2: 100%     There is no height or weight on file to calculate BMI.  Physical Exam   Exam performed over telemedicine with 2-way video and audio communication and with assistance of bedside RN  Physical Exam Gen: A&O x4, NAD Resp: normal WOB CV: extremities appear well-perfused  Neuro: *MS: A&O x4. Follows multi-step commands.  *Speech: nondysarthric, no aphasia, able to name and repeat *CN: PERRL 3mm, EOMI, VFF by confrontation, sensation intact, smile symmetric, hearing intact to voice *Motor:   Normal bulk.  No tremor, rigidity or bradykinesia. No pronator drift. All extremities appear full-strength and symmetric. *Sensory: SILT. Symmetric. No double-simultaneous extinction.  *Coordination:  Finger-to-nose, heel-to-shin, rapid  alternating motions were intact. *Reflexes:  UTA 2/2 tele-exam *Gait: deferred  NIHSS  1a Level of Conscious.: *** 1b LOC Questions: *** 1c LOC Commands: *** 2 Best Gaze: *** 3 Visual: *** 4 Facial Palsy: *** 5a Motor Arm - left: *** 5b Motor Arm - Right: *** 6a Motor Leg - Left: *** 6b Motor Leg - Right: *** 7 Limb Ataxia: *** 8 Sensory: *** 9 Best Language: *** 10 Dysarthria: *** 11 Extinct. and Inatten.: ***  TOTAL: ***   Premorbid mRS = ***   Labs   CBC: No results for input(s): WBC, NEUTROABS, HGB, HCT, MCV, PLT in the last 168 hours.  Basic Metabolic Panel:  Lab Results  Component Value Date   NA 136 05/01/2017   K 3.5 05/01/2017   CO2 24 05/01/2017   GLUCOSE 164 (H) 05/01/2017   BUN 31 (H) 05/01/2017   CREATININE 1.14 (H) 05/01/2017   CALCIUM 9.1 05/01/2017   GFRNONAA 47 (L) 05/01/2017   GFRAA 54 (L) 05/01/2017   Lipid Panel: No results found for: LDLCALC HgbA1c: No results found for: HGBA1C Urine Drug Screen: No results found for: LABOPIA, COCAINSCRNUR, LABBENZ, AMPHETMU, THCU, LABBARB  Alcohol  Level No results found for: ETH  CT Head without contrast: ***  CT angio Head and Neck with contrast: ***  MRI Brain ***  rEEG: ***  Impression   ***  Recommendations  *** ______________________________________________________________________   Thank you for the opportunity to take part in the care of this patient. If you have any further questions, please contact the neurology consultation attending.  Signed,  Elida Ross, MD [facility] [phone]  If 7pm- 7am, please page neurology on call as listed in AMION.  **Any copied and pasted documentation in this note was written by me in another application not billed for and pasted by me into this document.     [1]  Allergies Allergen Reactions   Demerol [Meperidine]    Hydrocodone-Acetaminophen     REACTION: spinning   Meperidine Hcl      REACTION: hives   Vicodin [Hydrocodone-Acetaminophen]   [2]  Current Facility-Administered Medications:    0.9 %  sodium chloride  infusion, 500 mL, Intravenous, Continuous, Teressa Toribio SQUIBB, MD   sodium chloride  flush (NS) 0.9 % injection 3 mL, 3 mL, Intravenous, Once, Young, Travis J, DO  Current Outpatient Medications:    alendronate (FOSAMAX) 70 MG tablet, Take 70 mg by mouth once a week. Take with a full glass of water on an empty stomach., Disp: , Rfl:    ALPRAZolam (XANAX) 0.5 MG tablet, Take 0.5-1 mg by mouth every 6 (six) hours. (Patient not taking: Reported on 11/26/2023), Disp: , Rfl:    atenolol (  TENORMIN) 50 MG tablet, Take 50 mg by mouth daily., Disp: , Rfl:    busPIRone (BUSPAR) 15 MG tablet, Take 15 mg by mouth 2 (two) times daily., Disp: , Rfl:    Calcium Carbonate (CALCIUM 600 PO), Take 1 tablet by mouth 2 (two) times daily., Disp: , Rfl:    colestipol  (COLESTID ) 1 g tablet, TAKE 1 TABLET BY MOUTH 2 TIMES DAILY., Disp: 60 tablet, Rfl: 2   estradiol (ESTRACE) 0.1 MG/GM vaginal cream, Place 1 Applicatorful vaginally at bedtime. (Patient not taking: Reported on 11/26/2023), Disp: , Rfl:    hydrocortisone  (ANUSOL -HC) 2.5 % rectal cream, Place 1 application. rectally 2 (two) times daily., Disp: 30 g, Rfl: 1   mesalamine  (LIALDA ) 1.2 g EC tablet, TAKE 2 TABLETS BY MOUTH EVERY MORNING;, Disp: 180 tablet, Rfl: 1   omeprazole (PRILOSEC) 20 MG capsule, Take 20 mg by mouth daily., Disp: , Rfl:    sertraline (ZOLOFT) 100 MG tablet, Take 100 mg by mouth daily., Disp: , Rfl:    TRIMETHOPRIM PO, Take by mouth daily. Takes 2 tablets daily, Disp: , Rfl:    verapamil (VERELAN PM) 240 MG 24 hr capsule, Take 240 mg by mouth at bedtime., Disp: , Rfl:

## 2024-01-06 NOTE — ED Provider Notes (Addendum)
 " [place] [facility] AT [facility] Provider Note   CSN: [id number] Arrival date & time: 01/06/24  1337  An emergency department physician performed an initial assessment on this suspected stroke patient at 1350.  Patient presents with: Altered Mental Status   Rhonda Woods is a 78 y.o. female.   This is a 78 year old female presenting emergency department for altered mental status.  Seemingly acute last known normal was 9:25 AM.  Did take Xanax for the first time prior to symptom onset.  Noted to have confusion, word finding difficulty and off balance.  No reported trauma.  No infectious symptoms.  Denies chest pain or shortness of breath.   Altered Mental Status      Prior to Admission medications  Medication Sig Start Date End Date Taking? Authorizing Provider  alendronate (FOSAMAX) 70 MG tablet Take 70 mg by mouth once a week. Take with a full glass of water on an empty stomach.    [provider]  ALPRAZolam (XANAX) 0.5 MG tablet Take 0.5-1 mg by mouth every 6 (six) hours. Patient not taking: Reported on 11/26/2023    [provider]  atenolol (TENORMIN) 50 MG tablet Take 50 mg by mouth daily.    [provider]  busPIRone (BUSPAR) 15 MG tablet Take 15 mg by mouth 2 (two) times daily.    [provider]  Calcium Carbonate (CALCIUM 600 PO) Take 1 tablet by mouth 2 (two) times daily.    [provider]  colestipol  (COLESTID ) 1 g tablet TAKE 1 TABLET BY MOUTH 2 TIMES DAILY. 05/22/21   Craig Alan SAUNDERS, PA-C  estradiol (ESTRACE) 0.1 MG/GM vaginal cream Place 1 Applicatorful vaginally at bedtime. Patient not taking: Reported on 11/26/2023    [provider]  hydrocortisone  (ANUSOL -HC) 2.5 % rectal cream Place 1 application. rectally 2 (two) times daily. 04/22/21   Craig Alan SAUNDERS, PA-C  mesalamine  (LIALDA ) 1.2 g EC tablet TAKE 2 TABLETS BY MOUTH EVERY MORNING; 11/26/23   McMichael, Bayley M, PA-C  omeprazole  (PRILOSEC) 20 MG capsule Take 20 mg by mouth daily.    [provider]  sertraline (ZOLOFT) 100 MG tablet Take 100 mg by mouth daily.    [provider]  TRIMETHOPRIM PO Take by mouth daily. Takes 2 tablets daily    [provider]  verapamil (VERELAN PM) 240 MG 24 hr capsule Take 240 mg by mouth at bedtime.    [provider]    Allergies: Demerol [meperidine], Hydrocodone-acetaminophen, Meperidine hcl, and Vicodin [hydrocodone-acetaminophen]    Review of Systems  Updated Vital Signs BP (!) 171/87 (BP Location: Right Arm)   Pulse (!) 56   Temp (!) 97.5 F (36.4 C)   Resp 16   Wt 57 kg   SpO2 100%   BMI 20.91 kg/m   Physical Exam Vitals and nursing note reviewed.  Constitutional:      General: She is not in acute distress.    Appearance: She is not toxic-appearing.  HENT:     Head: Normocephalic.     Nose: Nose normal.     Mouth/Throat:     Mouth: Mucous membranes are moist.  Eyes:     Conjunctiva/sclera: Conjunctivae normal.  Cardiovascular:     Rate and Rhythm: Normal rate and regular rhythm.  Pulmonary:     Effort: Pulmonary effort is normal.  Abdominal:     General: There is no distension.  Skin:    General: Skin is warm and dry.  Capillary Refill: Capillary refill takes less than 2 seconds.  Neurological:     General: No focal deficit present.     Mental Status: She is alert and oriented to person, place, and time.     Cranial Nerves: No cranial nerve deficit.     Sensory: No sensory deficit.     Motor: No weakness.     Coordination: Coordination normal.     Gait: Gait normal.  Psychiatric:        Mood and Affect: Mood normal.        Behavior: Behavior normal.     (all labs ordered are listed, but only abnormal results are displayed) Labs Reviewed  CBC - Abnormal; Notable for the following components:      Result Value   WBC 10.9 (*)    All other components within normal limits  DIFFERENTIAL - Abnormal; Notable  for the following components:   Neutro Abs 8.3 (*)    All other components within normal limits  PROTIME-INR  APTT  COMPREHENSIVE METABOLIC PANEL WITH GFR  ETHANOL  URINALYSIS, ROUTINE W REFLEX MICROSCOPIC  CBG MONITORING, ED  CBG MONITORING, ED    EKG: None  Radiology: CT HEAD CODE STROKE WO CONTRAST Result Date: 01/06/2024 EXAM: CT HEAD WITHOUT CONTRAST 01/06/2024 01:56:06 PM TECHNIQUE: CT of the head was performed without the administration of intravenous contrast. Automated exposure control, iterative reconstruction, and/or weight based adjustment of the mA/kV was utilized to reduce the radiation dose to as low as reasonably achievable. COMPARISON: None available. CLINICAL HISTORY: Neuro deficit, acute, stroke suspected. FINDINGS: BRAIN AND VENTRICLES: No acute hemorrhage. No evidence of acute infarct. No hydrocephalus. No extra-axial collection. No mass effect or midline shift. Mild chronic microvascular ischemic changes. Mild atherosclerosis of the carotid siphons. ORBITS: Bilateral lens replacement. No acute abnormality. SINUSES: No acute abnormality. SOFT TISSUES AND SKULL: No acute soft tissue abnormality. No skull fracture. [place] Stroke Program Early CT (ASPECT) Score: Ganglionic (caudate, IC, lentiform nucleus, insula, M1-M3): 7 Supraganglionic (M4-M6): 3 Total: 10 IMPRESSION: 1. No acute intracranial abnormality. 2. ASPECTS 10. 3. Mild chronic microvascular ischemic changes. 4. Findings discussed with Dr. Neysa at 2:08 PM on 01/06/24. Electronically signed by: Donnice Mania MD 01/06/2024 02:09 PM EST RP Workstation: [id number]     Procedures   Medications Ordered in the ED  sodium chloride  flush (NS) 0.9 % injection 3 mL (has no administration in time range)    Clinical Course as of 01/06/24 1511  Tue Jan 06, 2024  1501 Patient continues to do well.  Reports that she nearly feels back to her normal self feeling female that is keeping her from feeling normal is anxiety.   Family did present to bedside to provide additional history and noted that her Xanax dose was increased yesterday today from 0.5 mg to 1 mg.  Today was the first day that she took the increased dose.  Daughter notes that she counted the pills and they were 2 pills missing so she think she took a double dose. [TY]    Clinical Course User Index [TY] Neysa Caron PARAS, DO                                 Medical Decision Making This is a 78 year old female presenting emergency department for confusion, off balance.  Symptoms started this morning at 925.  Took a Xanax shortly before symptoms started.  Was activated as a code stroke from  triage.  She is not having any localizing deficits or weakness.  Her symptoms are rapidly improving.  CT head without acute intracranial process on my independent review.  Radiology called and agreed there is no acute abnormalities.  Neurology performed assessment via virtual examination.  No TNK indicated.  Symptoms likely secondary to medication.  Neurology recommending plan to observe, get basic screening labs.  If patient's symptoms worsen or fail to completely resolve then plan for MRI.  Screening labs ordered.   Care signed out to afternoon team.  Disposition pending completion of labs and reevaluation.  Amount and/or Complexity of Data Reviewed External Data Reviewed:     Details: Past medical history to include IBS/ulcerative colitis, anxiety, GERD, hyperlipidemia Labs: ordered. Decision-making details documented in ED Course.    Details: See ED course Radiology: ordered and independent interpretation performed.    Details: See above ECG/medicine tests: ordered and independent interpretation performed. Decision-making details documented in ED Course.  Risk Decision regarding hospitalization. Diagnosis or treatment significantly limited by social determinants of health.   Coags ordered as part of stroke panel.     Final diagnoses:  Transient alteration  of awareness    ED Discharge Orders     None          Neysa Caron PARAS, DO 01/06/24 1511    Neysa Caron PARAS, DO 02/10/24 1405  "

## 2024-01-06 NOTE — ED Triage Notes (Signed)
 Presents to ED with family with c/o acute confusion and unsteadiness. LKW [id number]. Daughter went to pick up pt for MD appt and pt couldn't remember where she was supposed to go and why. Daughter also reports unsteadiness and pt almost fell while getting around the house PTA. Pt also had first dose of lorazepam 2mg  this AM (specific time unknown). In triage, pt noticeably unsteady but able to ambulate. Also delayed responses. Code stroke initiated in triage, Dr Neysa notified.

## 2024-01-06 NOTE — ED Provider Notes (Signed)
°  Physical Exam  BP (!) 171/87 (BP Location: Right Arm)   Pulse (!) 56   Temp (!) 97.5 F (36.4 C)   Resp 16   Wt 57 kg   SpO2 100%   BMI 20.91 kg/m   Physical Exam  Procedures  Procedures  ED Course / MDM   Clinical Course as of 01/06/24 1519  Tue Jan 06, 2024  1501 Patient continues to do well.  Reports that she nearly feels back to her normal self feeling female that is keeping her from feeling normal is anxiety.  Family did present to bedside to provide additional history and noted that her Xanax dose was increased yesterday today from 0.5 mg to 1 mg.  Today was the first day that she took the increased dose.  Daughter notes that she counted the pills and they were 2 pills missing so she think she took a double dose. [TY]    Clinical Course User Index [TY] Neysa Caron PARAS, DO   Medical Decision Making Amount and/or Complexity of Data Reviewed Labs: ordered. Radiology: ordered. ECG/medicine tests: ordered.   Assuming care of patient from Dr. Neysa.   Patient in the ED for altered mental status. Came in to the ER with confusion, unsteadiness.  Pt was recently increased on xanax. It appears that patient took double the dose of increased xanax. Workup thus far shows -no acute changes. Pt has improved in the ER. She is back to baseline. Dr. Matthews has evaluated and cleared from neuro perspective.    Concerning findings are as following - none Important pending results are labs  According to Dr. Neysa, plan is to d/c patient is labs are reassuring.   Patient had no complains, no concerns from the nursing side. Will continue to monitor.    Patient reassessed at 430.  Her urine test is pending.  She is AO x 3.  Her neurologic exam is as below:  Cerebellar exam is normal (finger to nose) Sensory exam normal for bilateral upper and lower extremities - and patient is able to discriminate between sharp and dull. Motor exam is 4+/5 bilateral upper and lower extremity.   Patient just ambulated per family without any difficulty.   Reassessment: UA is back and normal.  Patient has some urinary frequency, but clinically does not appear that she has UTI and UA is normal. Patient on repeat evaluation remains at baseline.  She is AO x 3, having conversation.  Family is comfortable taking her home.  We suspect Xanax might have contributed to her symptoms.  She will follow-up with her PCP, and family will ensure patient will not be taking any Xanax until cleared by PCP.       Charlyn Sora, MD 01/06/24 1726

## 2024-02-11 ENCOUNTER — Other Ambulatory Visit: Payer: Self-pay | Admitting: Internal Medicine

## 2024-02-11 DIAGNOSIS — G3184 Mild cognitive impairment, so stated: Secondary | ICD-10-CM

## 2024-03-15 ENCOUNTER — Other Ambulatory Visit
